# Patient Record
Sex: Female | Born: 1945
Health system: Southern US, Community
[De-identification: ages and names within clinical notes are randomized; demographics above are authoritative.]

## PROBLEM LIST (undated history)

## (undated) DIAGNOSIS — K219 Gastro-esophageal reflux disease without esophagitis: Secondary | ICD-10-CM

## (undated) DIAGNOSIS — G4730 Sleep apnea, unspecified: Secondary | ICD-10-CM

## (undated) DIAGNOSIS — I10 Essential (primary) hypertension: Secondary | ICD-10-CM

## (undated) DIAGNOSIS — E119 Type 2 diabetes mellitus without complications: Secondary | ICD-10-CM

## (undated) DIAGNOSIS — E785 Hyperlipidemia, unspecified: Secondary | ICD-10-CM

## (undated) HISTORY — PX: APPENDECTOMY: SHX54

## (undated) HISTORY — PX: TUBAL LIGATION: SHX77

## (undated) HISTORY — DX: Essential (primary) hypertension: I10

## (undated) HISTORY — PX: BACK SURGERY: SHX140

## (undated) HISTORY — DX: Hyperlipidemia, unspecified: E78.5

---

## 1999-11-04 ENCOUNTER — Encounter: Payer: Self-pay | Admitting: Family Medicine

## 1999-11-04 ENCOUNTER — Ambulatory Visit (HOSPITAL_COMMUNITY): Admission: RE | Admit: 1999-11-04 | Discharge: 1999-11-04 | Payer: Self-pay | Admitting: Family Medicine

## 1999-11-15 ENCOUNTER — Ambulatory Visit (HOSPITAL_COMMUNITY): Admission: RE | Admit: 1999-11-15 | Discharge: 1999-11-16 | Payer: Self-pay | Admitting: Neurosurgery

## 1999-11-15 ENCOUNTER — Encounter: Payer: Self-pay | Admitting: Neurosurgery

## 2000-10-09 ENCOUNTER — Ambulatory Visit (HOSPITAL_COMMUNITY): Admission: RE | Admit: 2000-10-09 | Discharge: 2000-10-09 | Payer: Self-pay | Admitting: Family Medicine

## 2000-10-09 ENCOUNTER — Encounter: Payer: Self-pay | Admitting: Family Medicine

## 2001-10-21 ENCOUNTER — Ambulatory Visit (HOSPITAL_COMMUNITY): Admission: RE | Admit: 2001-10-21 | Discharge: 2001-10-21 | Payer: Self-pay | Admitting: Family Medicine

## 2001-10-21 ENCOUNTER — Encounter: Payer: Self-pay | Admitting: Family Medicine

## 2002-10-13 ENCOUNTER — Ambulatory Visit (HOSPITAL_COMMUNITY): Admission: RE | Admit: 2002-10-13 | Discharge: 2002-10-13 | Payer: Self-pay | Admitting: Family Medicine

## 2002-10-13 ENCOUNTER — Encounter: Payer: Self-pay | Admitting: Family Medicine

## 2002-12-31 ENCOUNTER — Ambulatory Visit (HOSPITAL_COMMUNITY): Admission: RE | Admit: 2002-12-31 | Discharge: 2002-12-31 | Payer: Self-pay | Admitting: Family Medicine

## 2003-02-02 ENCOUNTER — Ambulatory Visit: Admission: RE | Admit: 2003-02-02 | Discharge: 2003-02-02 | Payer: Self-pay | Admitting: Family Medicine

## 2003-03-23 ENCOUNTER — Ambulatory Visit (HOSPITAL_COMMUNITY): Admission: RE | Admit: 2003-03-23 | Discharge: 2003-03-23 | Payer: Self-pay | Admitting: Internal Medicine

## 2006-05-14 ENCOUNTER — Ambulatory Visit (HOSPITAL_COMMUNITY): Admission: RE | Admit: 2006-05-14 | Discharge: 2006-05-14 | Payer: Self-pay | Admitting: Family Medicine

## 2006-06-11 ENCOUNTER — Ambulatory Visit (HOSPITAL_COMMUNITY): Admission: RE | Admit: 2006-06-11 | Discharge: 2006-06-11 | Payer: Self-pay | Admitting: Family Medicine

## 2006-07-16 ENCOUNTER — Ambulatory Visit (HOSPITAL_COMMUNITY): Admission: RE | Admit: 2006-07-16 | Discharge: 2006-07-16 | Payer: Self-pay | Admitting: Family Medicine

## 2007-07-08 ENCOUNTER — Ambulatory Visit (HOSPITAL_COMMUNITY): Admission: RE | Admit: 2007-07-08 | Discharge: 2007-07-08 | Payer: Self-pay | Admitting: Family Medicine

## 2009-06-30 ENCOUNTER — Ambulatory Visit (HOSPITAL_COMMUNITY): Admission: RE | Admit: 2009-06-30 | Discharge: 2009-06-30 | Payer: Self-pay | Admitting: Family Medicine

## 2010-07-21 ENCOUNTER — Other Ambulatory Visit (HOSPITAL_COMMUNITY): Payer: Self-pay | Admitting: Family Medicine

## 2010-07-21 DIAGNOSIS — Z139 Encounter for screening, unspecified: Secondary | ICD-10-CM

## 2010-07-22 NOTE — Op Note (Signed)
. [facility]  Patient:    Maria Morrison, Maria Morrison                     MRN: [id number] Proc. Date: 11/15/99 Adm. Date:  [id number] Attending:  Donn Pierini                           Operative Report  PREOPERATIVE DIAGNOSIS:  Right L2-3 extraforaminal herniated nucleus pulposus with radiculopathy.  POSTOPERATIVE DIAGNOSIS:  Right L2-3 extraforaminal herniated nucleus pulposus with radiculopathy.  OPERATION PERFORMED:  Right L2-3 extraforaminal microdiskectomy.  SURGEON:  Julio Sicks, M.D.  ASSISTANT:  Reinaldo Meeker, M.D.  ANESTHESIA:  General endotracheal.  INDICATIONS FOR PROCEDURE:  Ms. Collison is a 65 year old female with history of severe back and left lower extremity pain consistent with an L2 radiculopathy.  This has failed conservative management.  MRI scan demonstrates a large left-sided L2-3 extraforaminal disk herniation with compression of the left-sided L2 nerve root.  The patient has been counseled as to her options.  She has decided to proceed with the left-sided L2-3 extraforaminal microdiskectomy for hopeful relief of her symptoms.  DESCRIPTION OF PROCEDURE:  The patient was taken to the operating room and placed on the table in supine position.  After adequate level of anesthesia was achieved, the patient was positioned prone onto a wilson frame and appropriately padded.  The patients lumbar region was shaved and prepped steriley.  A 10 blade was used to make a linear skin incision overlying the L2-3 level.  This was carried down sharply in the midline.  A subperiosteal dissection was performed exposing the transverse process and facet of L2 as well as the superior facet of L3.  A deep ____________ was placed.  X-rays taken.  The level was confirmed.  An extraforaminal approach was then performed by removing the anterior aspect of the transverse process and lateral aspect of the pars interarticularis using Kerrison rongeurs.   The ligament was then elevated and resected in piecemeal fashion.  The microscope was brought into the field and used for microdissection.  The exiting L2 nerve root was identified.  Paraneural venous plexus was coagulated and cut.  The nerve root was elevated gently superiorly.  A large amount of free disk herniation was then encountered and dissected free using blunt nerve hooks and Penfield instrument.  All free elements of disk herniation were completely removed.  The disk space was isolated and incised with a 15 blade in rectangular fashion.  A wide disk space cleanout was then achieved using pituitary rongeurs, upward angled pituitary rongeurs and Epstein curets.  All loose or obviously degenerated disk material was removed from the interspace. At this point there was no evidence of any further compression of the left-sided L2 nerve root.  The wound was then copiously irrigated with antibiotic solution.  Gelfoam was placed topically for hemostasis.  The wound was then closed in layers with Vicryl sutures.  Staples were applied to the surface.  There were no apparent complications.  The patient tolerated the procedure well and she returned to the recovery postoperatively. DD:  11/15/99 TD:  11/16/99 Job: [id number]

## 2010-07-22 NOTE — Op Note (Signed)
NAME:  Maria Morrison, Maria Morrison                        ACCOUNT NO.:  [id number]   MEDICAL RECORD NO.:  [id number]                   PATIENT TYPE:  AMB   LOCATION:  DAY                                  FACILITY:  [facility]   PHYSICIAN:  R. Roetta Sessions, M.D.              DATE OF BIRTH:  [birth year]   DATE OF PROCEDURE:  DATE OF DISCHARGE:                                 OPERATIVE REPORT   PROCEDURE:  Colonoscopy with biopsy.   ENDOSCOPIST:  Gerrit Friends. Rourk, M.D.   INDICATIONS FOR PROCEDURE:  The patient is a 65 year old lady sent over out  of the courtesy of Dr. Patrica Duel for colorectal cancer screening.  She  has never had a colonoscopy.  She is devoid of any lower GI tract symptoms.  There is no family history of colorectal neoplasia.  Colonoscopy is now  being done as a screening maneuver.  This approach has been discussed with  the patient at length.  The potential risks, benefits, and alternatives have  been reviewed.  Please see my handwritten H&P for more information.   PROCEDURE NOTE:  O2 saturation, blood pressure, pulse and respirations were  monitored throughout the entire procedure.  Conscious sedation: Versed 3 mg IV, Demerol 75 mg IV in divided doses.   INSTRUMENT:  Olympus 140 colonoscope.   FINDINGS:  Digital rectal exam revealed no abnormalities.   ENDOSCOPIC FINDINGS:  The prep was good.   RECTUM:  Examination of the rectal mucosa including the retroflex view of  the anal verge revealed only 2 anal papilla and internal hemorrhoids.  The  remainder of the rectal mucosa appeared normal.   COLON:  The colonic mucosa was surveyed from the rectosigmoid junction  through the left transverse and right colon to the area of the appendiceal  orifice, ileocecal valve, and cecum.  These structures were well seen and  photographed for the record.   From this level the scope was slowly withdrawn.  All previously mentioned  mucosal surfaces were again seen.  The only abnormality  was a 3-cm, sessile  polyp at 30 cm which was cold biopsied/removed.  The patient tolerated the  procedure well and was reacted in endoscopy.   IMPRESSION:  1. Anal papilla and internal hemorrhoids, otherwise normal rectum.  2. Diminutive polyp at 30 cm; cold biopsied/removed.  3. The remainder of the colonic mucosa appeared normal.   RECOMMENDATIONS:  1. Follow up on path.  2. Further recommendations to follow.      ___________________________________________                                            Jonathon Bellows, M.D.   RMR/MEDQ  D:  03/23/2003  T:  03/23/2003  Job:  [id number]   cc:   Patrica Duel, M.D.  [address]  [address]  [place]  [place] [address]  Fax: [phone]   R. Roetta Sessions, M.D.  [address]  [place]  [place] [address]  Fax: [phone]

## 2010-07-25 ENCOUNTER — Ambulatory Visit (HOSPITAL_COMMUNITY)
Admission: RE | Admit: 2010-07-25 | Discharge: 2010-07-25 | Disposition: A | Discharge status: Home / Self Care | Payer: Medicare Other | Source: Ambulatory Visit | Attending: Family Medicine | Admitting: Family Medicine

## 2010-07-25 DIAGNOSIS — Z139 Encounter for screening, unspecified: Secondary | ICD-10-CM

## 2010-07-25 DIAGNOSIS — Z1231 Encounter for screening mammogram for malignant neoplasm of breast: Secondary | ICD-10-CM | POA: Insufficient documentation

## 2011-03-14 ENCOUNTER — Other Ambulatory Visit (HOSPITAL_COMMUNITY): Payer: Self-pay | Admitting: Family Medicine

## 2011-03-14 DIAGNOSIS — I10 Essential (primary) hypertension: Secondary | ICD-10-CM | POA: Diagnosis not present

## 2011-03-14 DIAGNOSIS — Z139 Encounter for screening, unspecified: Secondary | ICD-10-CM

## 2011-03-14 DIAGNOSIS — E119 Type 2 diabetes mellitus without complications: Secondary | ICD-10-CM | POA: Diagnosis not present

## 2011-03-14 DIAGNOSIS — Z6832 Body mass index (BMI) 32.0-32.9, adult: Secondary | ICD-10-CM | POA: Diagnosis not present

## 2011-03-21 ENCOUNTER — Other Ambulatory Visit (HOSPITAL_COMMUNITY): Payer: Medicare Other

## 2011-03-27 ENCOUNTER — Ambulatory Visit (HOSPITAL_COMMUNITY)
Admission: RE | Admit: 2011-03-27 | Discharge: 2011-03-27 | Disposition: A | Discharge status: Home / Self Care | Payer: Medicare Other | Source: Ambulatory Visit | Attending: Family Medicine | Admitting: Family Medicine

## 2011-03-27 DIAGNOSIS — Z13820 Encounter for screening for osteoporosis: Secondary | ICD-10-CM | POA: Insufficient documentation

## 2011-03-27 DIAGNOSIS — Z139 Encounter for screening, unspecified: Secondary | ICD-10-CM

## 2011-03-27 DIAGNOSIS — M949 Disorder of cartilage, unspecified: Secondary | ICD-10-CM | POA: Diagnosis not present

## 2011-03-27 DIAGNOSIS — M899 Disorder of bone, unspecified: Secondary | ICD-10-CM | POA: Insufficient documentation

## 2011-03-27 DIAGNOSIS — Z780 Asymptomatic menopausal state: Secondary | ICD-10-CM | POA: Insufficient documentation

## 2011-07-07 DIAGNOSIS — E119 Type 2 diabetes mellitus without complications: Secondary | ICD-10-CM | POA: Diagnosis not present

## 2011-07-07 DIAGNOSIS — H5200 Hypermetropia, unspecified eye: Secondary | ICD-10-CM | POA: Diagnosis not present

## 2011-07-07 DIAGNOSIS — H52229 Regular astigmatism, unspecified eye: Secondary | ICD-10-CM | POA: Diagnosis not present

## 2011-07-07 DIAGNOSIS — H04129 Dry eye syndrome of unspecified lacrimal gland: Secondary | ICD-10-CM | POA: Diagnosis not present

## 2011-10-24 DIAGNOSIS — M779 Enthesopathy, unspecified: Secondary | ICD-10-CM | POA: Diagnosis not present

## 2011-10-24 DIAGNOSIS — Z6832 Body mass index (BMI) 32.0-32.9, adult: Secondary | ICD-10-CM | POA: Diagnosis not present

## 2011-10-24 DIAGNOSIS — I10 Essential (primary) hypertension: Secondary | ICD-10-CM | POA: Diagnosis not present

## 2011-10-24 DIAGNOSIS — E119 Type 2 diabetes mellitus without complications: Secondary | ICD-10-CM | POA: Diagnosis not present

## 2011-10-24 DIAGNOSIS — Z7182 Exercise counseling: Secondary | ICD-10-CM | POA: Diagnosis not present

## 2011-10-30 DIAGNOSIS — M779 Enthesopathy, unspecified: Secondary | ICD-10-CM | POA: Diagnosis not present

## 2011-10-30 DIAGNOSIS — E119 Type 2 diabetes mellitus without complications: Secondary | ICD-10-CM | POA: Diagnosis not present

## 2012-06-11 ENCOUNTER — Other Ambulatory Visit (HOSPITAL_COMMUNITY): Payer: Self-pay | Admitting: Family Medicine

## 2012-06-11 DIAGNOSIS — E785 Hyperlipidemia, unspecified: Secondary | ICD-10-CM | POA: Diagnosis not present

## 2012-06-11 DIAGNOSIS — Z7182 Exercise counseling: Secondary | ICD-10-CM | POA: Diagnosis not present

## 2012-06-11 DIAGNOSIS — N182 Chronic kidney disease, stage 2 (mild): Secondary | ICD-10-CM | POA: Diagnosis not present

## 2012-06-11 DIAGNOSIS — Z139 Encounter for screening, unspecified: Secondary | ICD-10-CM

## 2012-06-11 DIAGNOSIS — I10 Essential (primary) hypertension: Secondary | ICD-10-CM | POA: Diagnosis not present

## 2012-06-11 DIAGNOSIS — E1129 Type 2 diabetes mellitus with other diabetic kidney complication: Secondary | ICD-10-CM | POA: Diagnosis not present

## 2012-06-11 DIAGNOSIS — Z713 Dietary counseling and surveillance: Secondary | ICD-10-CM | POA: Diagnosis not present

## 2012-06-11 DIAGNOSIS — R809 Proteinuria, unspecified: Secondary | ICD-10-CM | POA: Diagnosis not present

## 2012-06-17 ENCOUNTER — Ambulatory Visit (HOSPITAL_COMMUNITY)
Admission: RE | Admit: 2012-06-17 | Discharge: 2012-06-17 | Disposition: A | Discharge status: Home / Self Care | Payer: Medicare Other | Source: Ambulatory Visit | Attending: Family Medicine | Admitting: Family Medicine

## 2012-06-17 DIAGNOSIS — Z1231 Encounter for screening mammogram for malignant neoplasm of breast: Secondary | ICD-10-CM | POA: Insufficient documentation

## 2012-06-17 DIAGNOSIS — Z139 Encounter for screening, unspecified: Secondary | ICD-10-CM

## 2012-06-18 ENCOUNTER — Other Ambulatory Visit: Payer: Self-pay | Admitting: Family Medicine

## 2012-06-18 DIAGNOSIS — R928 Other abnormal and inconclusive findings on diagnostic imaging of breast: Secondary | ICD-10-CM

## 2012-07-03 ENCOUNTER — Ambulatory Visit (HOSPITAL_COMMUNITY)
Admission: RE | Admit: 2012-07-03 | Discharge: 2012-07-03 | Disposition: A | Discharge status: Home / Self Care | Payer: Medicare Other | Source: Ambulatory Visit | Attending: Family Medicine | Admitting: Family Medicine

## 2012-07-03 DIAGNOSIS — R928 Other abnormal and inconclusive findings on diagnostic imaging of breast: Secondary | ICD-10-CM | POA: Insufficient documentation

## 2012-07-10 ENCOUNTER — Other Ambulatory Visit (HOSPITAL_COMMUNITY): Payer: Self-pay | Admitting: Physician Assistant

## 2012-07-10 ENCOUNTER — Ambulatory Visit (HOSPITAL_COMMUNITY)
Admission: RE | Admit: 2012-07-10 | Discharge: 2012-07-10 | Disposition: A | Discharge status: Home / Self Care | Payer: Medicare Other | Source: Ambulatory Visit | Attending: Physician Assistant | Admitting: Physician Assistant

## 2012-07-10 DIAGNOSIS — M25569 Pain in unspecified knee: Secondary | ICD-10-CM | POA: Insufficient documentation

## 2012-07-10 DIAGNOSIS — M25562 Pain in left knee: Secondary | ICD-10-CM

## 2012-07-10 DIAGNOSIS — Z6831 Body mass index (BMI) 31.0-31.9, adult: Secondary | ICD-10-CM | POA: Diagnosis not present

## 2012-07-16 DIAGNOSIS — H5200 Hypermetropia, unspecified eye: Secondary | ICD-10-CM | POA: Diagnosis not present

## 2012-07-16 DIAGNOSIS — E119 Type 2 diabetes mellitus without complications: Secondary | ICD-10-CM | POA: Diagnosis not present

## 2012-07-16 DIAGNOSIS — H269 Unspecified cataract: Secondary | ICD-10-CM | POA: Diagnosis not present

## 2012-07-16 DIAGNOSIS — H04129 Dry eye syndrome of unspecified lacrimal gland: Secondary | ICD-10-CM | POA: Diagnosis not present

## 2012-07-23 DIAGNOSIS — Z6831 Body mass index (BMI) 31.0-31.9, adult: Secondary | ICD-10-CM | POA: Diagnosis not present

## 2012-07-23 DIAGNOSIS — I10 Essential (primary) hypertension: Secondary | ICD-10-CM | POA: Diagnosis not present

## 2012-07-23 DIAGNOSIS — E1129 Type 2 diabetes mellitus with other diabetic kidney complication: Secondary | ICD-10-CM | POA: Diagnosis not present

## 2012-07-23 DIAGNOSIS — M779 Enthesopathy, unspecified: Secondary | ICD-10-CM | POA: Diagnosis not present

## 2012-07-30 DIAGNOSIS — S8990XA Unspecified injury of unspecified lower leg, initial encounter: Secondary | ICD-10-CM | POA: Diagnosis not present

## 2012-07-30 DIAGNOSIS — S99919A Unspecified injury of unspecified ankle, initial encounter: Secondary | ICD-10-CM | POA: Diagnosis not present

## 2012-08-03 DIAGNOSIS — M25569 Pain in unspecified knee: Secondary | ICD-10-CM | POA: Diagnosis not present

## 2012-12-11 DIAGNOSIS — Z23 Encounter for immunization: Secondary | ICD-10-CM | POA: Diagnosis not present

## 2013-06-13 DIAGNOSIS — Z6832 Body mass index (BMI) 32.0-32.9, adult: Secondary | ICD-10-CM | POA: Diagnosis not present

## 2013-06-13 DIAGNOSIS — I10 Essential (primary) hypertension: Secondary | ICD-10-CM | POA: Diagnosis not present

## 2013-06-13 DIAGNOSIS — E785 Hyperlipidemia, unspecified: Secondary | ICD-10-CM | POA: Diagnosis not present

## 2013-06-13 DIAGNOSIS — Z23 Encounter for immunization: Secondary | ICD-10-CM | POA: Diagnosis not present

## 2013-06-13 DIAGNOSIS — E1129 Type 2 diabetes mellitus with other diabetic kidney complication: Secondary | ICD-10-CM | POA: Diagnosis not present

## 2013-08-13 ENCOUNTER — Other Ambulatory Visit (HOSPITAL_COMMUNITY): Payer: Self-pay | Admitting: Family Medicine

## 2013-08-13 DIAGNOSIS — Z1231 Encounter for screening mammogram for malignant neoplasm of breast: Secondary | ICD-10-CM

## 2013-08-18 DIAGNOSIS — L570 Actinic keratosis: Secondary | ICD-10-CM | POA: Diagnosis not present

## 2013-08-18 DIAGNOSIS — C44320 Squamous cell carcinoma of skin of unspecified parts of face: Secondary | ICD-10-CM | POA: Diagnosis not present

## 2013-08-18 DIAGNOSIS — D235 Other benign neoplasm of skin of trunk: Secondary | ICD-10-CM | POA: Diagnosis not present

## 2013-08-18 DIAGNOSIS — I781 Nevus, non-neoplastic: Secondary | ICD-10-CM | POA: Diagnosis not present

## 2013-08-19 ENCOUNTER — Ambulatory Visit (HOSPITAL_COMMUNITY)
Admission: RE | Admit: 2013-08-19 | Discharge: 2013-08-19 | Disposition: A | Discharge status: Home / Self Care | Payer: Medicare Other | Source: Ambulatory Visit | Attending: Family Medicine | Admitting: Family Medicine

## 2013-08-19 DIAGNOSIS — Z1231 Encounter for screening mammogram for malignant neoplasm of breast: Secondary | ICD-10-CM

## 2013-09-17 DIAGNOSIS — Z85828 Personal history of other malignant neoplasm of skin: Secondary | ICD-10-CM | POA: Diagnosis not present

## 2013-09-17 DIAGNOSIS — L570 Actinic keratosis: Secondary | ICD-10-CM | POA: Diagnosis not present

## 2013-10-14 DIAGNOSIS — K649 Unspecified hemorrhoids: Secondary | ICD-10-CM | POA: Diagnosis not present

## 2013-10-14 DIAGNOSIS — Z6832 Body mass index (BMI) 32.0-32.9, adult: Secondary | ICD-10-CM | POA: Diagnosis not present

## 2013-10-14 DIAGNOSIS — N390 Urinary tract infection, site not specified: Secondary | ICD-10-CM | POA: Diagnosis not present

## 2013-11-22 DIAGNOSIS — Z23 Encounter for immunization: Secondary | ICD-10-CM | POA: Diagnosis not present

## 2013-12-10 DIAGNOSIS — E6609 Other obesity due to excess calories: Secondary | ICD-10-CM | POA: Diagnosis not present

## 2013-12-10 DIAGNOSIS — Z6832 Body mass index (BMI) 32.0-32.9, adult: Secondary | ICD-10-CM | POA: Diagnosis not present

## 2013-12-10 DIAGNOSIS — E119 Type 2 diabetes mellitus without complications: Secondary | ICD-10-CM | POA: Diagnosis not present

## 2013-12-10 DIAGNOSIS — E1129 Type 2 diabetes mellitus with other diabetic kidney complication: Secondary | ICD-10-CM | POA: Diagnosis not present

## 2013-12-10 DIAGNOSIS — E669 Obesity, unspecified: Secondary | ICD-10-CM | POA: Diagnosis not present

## 2013-12-10 DIAGNOSIS — N182 Chronic kidney disease, stage 2 (mild): Secondary | ICD-10-CM | POA: Diagnosis not present

## 2013-12-10 DIAGNOSIS — I10 Essential (primary) hypertension: Secondary | ICD-10-CM | POA: Diagnosis not present

## 2013-12-12 DIAGNOSIS — H52223 Regular astigmatism, bilateral: Secondary | ICD-10-CM | POA: Diagnosis not present

## 2013-12-12 DIAGNOSIS — H5203 Hypermetropia, bilateral: Secondary | ICD-10-CM | POA: Diagnosis not present

## 2013-12-12 DIAGNOSIS — E119 Type 2 diabetes mellitus without complications: Secondary | ICD-10-CM | POA: Diagnosis not present

## 2013-12-12 DIAGNOSIS — H2510 Age-related nuclear cataract, unspecified eye: Secondary | ICD-10-CM | POA: Diagnosis not present

## 2014-01-21 DIAGNOSIS — I10 Essential (primary) hypertension: Secondary | ICD-10-CM | POA: Diagnosis not present

## 2014-01-21 DIAGNOSIS — E6609 Other obesity due to excess calories: Secondary | ICD-10-CM | POA: Diagnosis not present

## 2014-01-21 DIAGNOSIS — Z6833 Body mass index (BMI) 33.0-33.9, adult: Secondary | ICD-10-CM | POA: Diagnosis not present

## 2014-02-03 ENCOUNTER — Telehealth: Payer: Self-pay

## 2014-02-03 ENCOUNTER — Other Ambulatory Visit: Payer: Self-pay

## 2014-02-03 DIAGNOSIS — Z1211 Encounter for screening for malignant neoplasm of colon: Secondary | ICD-10-CM

## 2014-02-03 NOTE — Telephone Encounter (Signed)
Patient received letter from DS to set up tcs. Patient said her insurance will be changing on 02/17/14 and had questions about coverage. Please call [phone]

## 2014-02-03 NOTE — Telephone Encounter (Signed)
Gastroenterology Pre-Procedure Review  Request Date: 02/03/2014 Requesting Physician: Delman Cheadle, NP  PATIENT REVIEW QUESTIONS: The patient responded to the following health history questions as indicated:    1. Diabetes Melitis: YES 2. Joint replacements in the past 12 months: no 3. Major health problems in the past 3 months: no 4. Has an artificial valve or MVP: no 5. Has a defibrillator: no 6. Has been advised in past to take antibiotics in advance of a procedure like teeth cleaning: no    MEDICATIONS & ALLERGIES:    Patient reports the following regarding taking any blood thinners:   Plavix? no Aspirin? no Coumadin? no  Patient confirms/reports the following medications:  Current Outpatient Prescriptions  Medication Sig Dispense Refill  . atorvastatin (LIPITOR) 10 MG tablet Take 10 mg by mouth daily.    Marland Kitchen lisinopril (PRINIVIL,ZESTRIL) 10 MG tablet Take 10 mg by mouth daily.    . Melatonin 3 MG CAPS Take 3 mg by mouth at bedtime.    . metFORMIN (GLUCOPHAGE) 500 MG tablet Take 500 mg by mouth daily with breakfast.    . polyethylene glycol (MIRALAX / GLYCOLAX) packet Take 17 g by mouth every other day.     No current facility-administered medications for this visit.    Patient confirms/reports the following allergies:  No Known Allergies  No orders of the defined types were placed in this encounter.    AUTHORIZATION INFORMATION Primary Insurance:   ID #:   Group #:  Pre-Cert / Auth required: Pre-Cert / Auth #:   Secondary Insurance:   ID #: Group #:  Pre-Cert / Auth required:  Pre-Cert / Auth #:   SCHEDULE INFORMATION: Procedure has been scheduled as follows:  Date: 02/25/2014                  Time: 9:30 AM  Location: [facility] Short Stay  This Gastroenterology Pre-Precedure Review Form is being routed to the following provider(s): R. Garfield Cornea, MD

## 2014-02-04 NOTE — Telephone Encounter (Signed)
Appropriate.  Take 1/2 dose of metformin the evening prior and none the day of.

## 2014-02-10 MED ORDER — PEG-KCL-NACL-NASULF-NA ASC-C 100 G PO SOLR: 1.0000 | ORAL | Status: DC

## 2014-02-10 NOTE — Telephone Encounter (Signed)
Rx sent to the pharmacy and instructions mailed to pt.  

## 2014-02-10 NOTE — Addendum Note (Signed)
Addended by: Everardo All on: 02/10/2014 10:32 AM   Modules accepted: Orders

## 2014-02-25 ENCOUNTER — Encounter (HOSPITAL_COMMUNITY)
Admission: RE | Disposition: A | Discharge status: Home / Self Care | Payer: Self-pay | Source: Ambulatory Visit | Attending: Internal Medicine

## 2014-02-25 ENCOUNTER — Ambulatory Visit (HOSPITAL_COMMUNITY)
Admission: RE | Admit: 2014-02-25 | Discharge: 2014-02-25 | Disposition: A | Discharge status: Home / Self Care | Payer: Medicare Other | Source: Ambulatory Visit | Attending: Internal Medicine | Admitting: Internal Medicine

## 2014-02-25 ENCOUNTER — Encounter (HOSPITAL_COMMUNITY): Payer: Self-pay

## 2014-02-25 DIAGNOSIS — K219 Gastro-esophageal reflux disease without esophagitis: Secondary | ICD-10-CM | POA: Diagnosis not present

## 2014-02-25 DIAGNOSIS — E119 Type 2 diabetes mellitus without complications: Secondary | ICD-10-CM | POA: Insufficient documentation

## 2014-02-25 DIAGNOSIS — K648 Other hemorrhoids: Secondary | ICD-10-CM | POA: Diagnosis not present

## 2014-02-25 DIAGNOSIS — D123 Benign neoplasm of transverse colon: Secondary | ICD-10-CM | POA: Insufficient documentation

## 2014-02-25 DIAGNOSIS — Z1211 Encounter for screening for malignant neoplasm of colon: Secondary | ICD-10-CM | POA: Diagnosis not present

## 2014-02-25 DIAGNOSIS — Z86010 Personal history of colonic polyps: Secondary | ICD-10-CM | POA: Insufficient documentation

## 2014-02-25 HISTORY — DX: Type 2 diabetes mellitus without complications: E11.9

## 2014-02-25 HISTORY — PX: COLONOSCOPY: SHX5424

## 2014-02-25 HISTORY — DX: Gastro-esophageal reflux disease without esophagitis: K21.9

## 2014-02-25 LAB — GLUCOSE, CAPILLARY: GLUCOSE-CAPILLARY: 113 mg/dL — AB (ref 70–99)

## 2014-02-25 SURGERY — COLONOSCOPY
Anesthesia: Moderate Sedation

## 2014-02-25 MED ORDER — STERILE WATER FOR IRRIGATION IR SOLN
Status: DC | PRN
  Administered 2014-02-25: 10:00:00

## 2014-02-25 MED ORDER — SODIUM CHLORIDE 0.9 % IV SOLN
INTRAVENOUS | Status: DC
  Administered 2014-02-25: 09:00:00 via INTRAVENOUS

## 2014-02-25 MED ORDER — ONDANSETRON HCL 4 MG/2ML IJ SOLN
INTRAMUSCULAR | Status: DC | PRN
  Administered 2014-02-25: 4 mg via INTRAVENOUS

## 2014-02-25 MED ORDER — ONDANSETRON HCL 4 MG/2ML IJ SOLN
INTRAMUSCULAR | Status: AC
  Filled 2014-02-25: qty 2

## 2014-02-25 MED ORDER — MEPERIDINE HCL 100 MG/ML IJ SOLN
INTRAMUSCULAR | Status: AC
  Filled 2014-02-25: qty 2

## 2014-02-25 MED ORDER — MIDAZOLAM HCL 5 MG/5ML IJ SOLN
INTRAMUSCULAR | Status: DC | PRN
  Administered 2014-02-25: 2 mg via INTRAVENOUS
  Administered 2014-02-25 (×3): 1 mg via INTRAVENOUS

## 2014-02-25 MED ORDER — MIDAZOLAM HCL 5 MG/5ML IJ SOLN
INTRAMUSCULAR | Status: AC
  Filled 2014-02-25: qty 10

## 2014-02-25 MED ORDER — MEPERIDINE HCL 100 MG/ML IJ SOLN
INTRAMUSCULAR | Status: DC | PRN
  Administered 2014-02-25 (×3): 25 mg via INTRAVENOUS

## 2014-02-25 NOTE — H&P (Signed)
'@LOGO' @   Primary Care Physician:  Leonides Grills, MD Primary Gastroenterologist:  Dr. Gala Romney  Pre-Procedure History & Physical: HPI:  Maria Morrison is a 68 y.o. female is here for a screening colonoscopy. Negative colonoscopy reportedly 10 years ago-records unavailable. No bowel symptoms. No family history of colon cancer.  Past Medical History  Diagnosis Date  . GERD (gastroesophageal reflux disease)   . Diabetes mellitus without complication     Past Surgical History  Procedure Laterality Date  . Appendectomy    . Tubal ligation    . Back surgery      Prior to Admission medications   Medication Sig Start Date End Date Taking? Authorizing Provider  acetaminophen (TYLENOL) 500 MG tablet Take 1,000 mg by mouth every 6 (six) hours as needed for moderate pain (for back and knee pain).   Yes Historical Provider, MD  atorvastatin (LIPITOR) 10 MG tablet Take 10 mg by mouth daily.   Yes Historical Provider, MD  lisinopril (PRINIVIL,ZESTRIL) 10 MG tablet Take 10 mg by mouth daily.   Yes Historical Provider, MD  Melatonin 3 MG CAPS Take 3 mg by mouth at bedtime.   Yes Historical Provider, MD  metFORMIN (GLUCOPHAGE) 500 MG tablet Take 500 mg by mouth daily with breakfast.   Yes Historical Provider, MD  peg 3350 powder (MOVIPREP) 100 G SOLR Take 1 kit (200 g total) by mouth as directed. 02/10/14  Yes Daneil Dolin, MD  polyethylene glycol [facility] / Floria Raveling) packet Take 17 g by mouth every other day.   Yes Historical Provider, MD    Allergies as of 02/03/2014  . (No Known Allergies)    History reviewed. No pertinent family history.  History   Social History  . Marital Status: Married    Spouse Name: N/A    Number of Children: N/A  . Years of Education: N/A   Occupational History  . Not on file.   Social History Main Topics  . Smoking status: Never Smoker   . Smokeless tobacco: Not on file  . Alcohol Use: No  . Drug Use: No  . Sexual Activity: Not on file   Other  Topics Concern  . Not on file   Social History Narrative  . No narrative on file    Review of Systems: See HPI, otherwise negative ROS  Physical Exam: BP 136/59 mmHg  Pulse 60  Temp(Src) 97.8 F (36.6 C) (Oral)  Resp 15  Ht '5\' 3"'  (1.6 m)  Wt 180 lb (81.647 kg)  BMI 31.89 kg/m2  SpO2 97% General:   Alert,  Well-developed, well-nourished, pleasant and cooperative in NAD Head:  Normocephalic and atraumatic. Eyes:  Sclera clear, no icterus.   Conjunctiva pink. Ears:  Normal auditory acuity. Nose:  No deformity, discharge,  or lesions. Mouth:  No deformity or lesions, dentition normal. Neck:  Supple; no masses or thyromegaly. Lungs:  Clear throughout to auscultation.   No wheezes, crackles, or rhonchi. No acute distress. Heart:  Regular rate and rhythm; no murmurs, clicks, rubs,  or gallops. Abdomen:  Soft, nontender and nondistended. No masses, hepatosplenomegaly or hernias noted. Normal bowel sounds, without guarding, and without rebound.   Msk:  Symmetrical without gross deformities. Normal posture. Pulses:  Normal pulses noted. Extremities:  Without clubbing or edema. Neurologic:  Alert and  oriented x4;  grossly normal neurologically. Skin:  Intact without significant lesions or rashes. Cervical Nodes:  No significant cervical adenopathy. Psych:  Alert and cooperative. Normal mood and affect.  Impression/Plan: Maria Go  B Morrison is now here to undergo a screening colonoscopy.  Average risk screening examination. Risks, benefits, limitations, imponderables and alternatives regarding colonoscopy have been reviewed with the patient. Questions have been answered. All parties agreeable.     Notice:  This dictation was prepared with Dragon dictation along with smaller phrase technology. Any transcriptional errors that result from this process are unintentional and may not be corrected upon review.

## 2014-02-25 NOTE — Discharge Instructions (Signed)
°Colonoscopy °Discharge Instructions ° °Read the instructions outlined below and refer to this sheet in the next few weeks. These discharge instructions provide you with general information on caring for yourself after you leave the hospital. Your doctor may also give you specific instructions. While your treatment has been planned according to the most current medical practices available, unavoidable complications occasionally occur. If you have any problems or questions after discharge, call Dr. Rourk at [phone]. °ACTIVITY °· You may resume your regular activity, but move at a slower pace for the next 24 hours.  °· Take frequent rest periods for the next 24 hours.  °· Walking will help get rid of the air and reduce the bloated feeling in your belly (abdomen).  °· No driving for 24 hours (because of the medicine (anesthesia) used during the test).   °· Do not sign any important legal documents or operate any machinery for 24 hours (because of the anesthesia used during the test).  °NUTRITION °· Drink plenty of fluids.  °· You may resume your normal diet as instructed by your doctor.  °· Begin with a light meal and progress to your normal diet. Heavy or fried foods are harder to digest and may make you feel sick to your stomach (nauseated).  °· Avoid alcoholic beverages for 24 hours or as instructed.  °MEDICATIONS °· You may resume your normal medications unless your doctor tells you otherwise.  °WHAT YOU CAN EXPECT TODAY °· Some feelings of bloating in the abdomen.  °· Passage of more gas than usual.  °· Spotting of blood in your stool or on the toilet paper.  °IF YOU HAD POLYPS REMOVED DURING THE COLONOSCOPY: °· No aspirin products for 7 days or as instructed.  °· No alcohol for 7 days or as instructed.  °· Eat a soft diet for the next 24 hours.  °FINDING OUT THE RESULTS OF YOUR TEST °Not all test results are available during your visit. If your test results are not back during the visit, make an appointment  with your caregiver to find out the results. Do not assume everything is normal if you have not heard from your caregiver or the medical facility. It is important for you to follow up on all of your test results.  °SEEK IMMEDIATE MEDICAL ATTENTION IF: °· You have more than a spotting of blood in your stool.  °· Your belly is swollen (abdominal distention).  °· You are nauseated or vomiting.  °· You have a temperature over 101.  °· You have abdominal pain or discomfort that is severe or gets worse throughout the day.  ° ° °Polyp information provided ° °Further recommendations to follow pending review of pathology report ° °Colon Polyps °Polyps are lumps of extra tissue growing inside the body. Polyps can grow in the large intestine (colon). Most colon polyps are noncancerous (benign). However, some colon polyps can become cancerous over time. Polyps that are larger than a pea may be harmful. To be safe, caregivers remove and test all polyps. °CAUSES  °Polyps form when mutations in the genes cause your cells to grow and divide even though no more tissue is needed. °RISK FACTORS °There are a number of risk factors that can increase your chances of getting colon polyps. They include: °· Being older than 50 years. °· Family history of colon polyps or colon cancer. °· Long-term colon diseases, such as colitis or Crohn disease. °· Being overweight. °· Smoking. °· Being inactive. °· Drinking too much alcohol. °SYMPTOMS  °  Most small polyps do not cause symptoms. If symptoms are present, they may include: °· Blood in the stool. The stool may look dark red or black. °· Constipation or diarrhea that lasts longer than 1 week. °DIAGNOSIS °People often do not know they have polyps until their caregiver finds them during a regular checkup. Your caregiver can use 4 tests to check for polyps: °· Digital rectal exam. The caregiver wears gloves and feels inside the rectum. This test would find polyps only in the rectum. °· Barium  enema. The caregiver puts a liquid called barium into your rectum before taking X-rays of your colon. Barium makes your colon look white. Polyps are dark, so they are easy to see in the X-ray pictures. °· Sigmoidoscopy. A thin, flexible tube (sigmoidoscope) is placed into your rectum. The sigmoidoscope has a light and tiny camera in it. The caregiver uses the sigmoidoscope to look at the last third of your colon. °· Colonoscopy. This test is like sigmoidoscopy, but the caregiver looks at the entire colon. This is the most common method for finding and removing polyps. °TREATMENT  °Any polyps will be removed during a sigmoidoscopy or colonoscopy. The polyps are then tested for cancer. °PREVENTION  °To help lower your risk of getting more colon polyps: °· Eat plenty of fruits and vegetables. Avoid eating fatty foods. °· Do not smoke. °· Avoid drinking alcohol. °· Exercise every day. °· Lose weight if recommended by your caregiver. °· Eat plenty of calcium and folate. Foods that are rich in calcium include milk, cheese, and broccoli. Foods that are rich in folate include chickpeas, kidney beans, and spinach. °HOME CARE INSTRUCTIONS °Keep all follow-up appointments as directed by your caregiver. You may need periodic exams to check for polyps. °SEEK MEDICAL CARE IF: °You notice bleeding during a bowel movement. °Document Released: 11/17/2003 Document Revised: 05/15/2011 Document Reviewed: 05/02/2011 °[employer]® Patient Information ©2015 [employer]. This information is not intended to replace advice given to you by your health care provider. Make sure you discuss any questions you have with your health care provider. ° °

## 2014-02-25 NOTE — Op Note (Signed)
[facility] [address] [place], [address]   COLONOSCOPY PROCEDURE REPORT  PATIENT: Maria Morrison, Maria Morrison  MR#: [id number] BIRTHDATE: 12-20-1945 , 20  yrs. old GENDER: female ENDOSCOPIST: R.  Garfield Cornea, MD FACP [facility] REFERRED YI:FOYDXAJ Orson Ape, M.D. PROCEDURE DATE:  03-07-14 PROCEDURE:   Colonoscopy with snare polypectomyg INDICATIONS:Average risk colorectal cancer screening examination. MEDICATIONS: Versed 5 mg IV and Demerol 75 mg IV in divided doses. Zofran 4 mg IV. ASA CLASS:       Class II  CONSENT: The risks, benefits, alternatives and imponderables including but not limited to bleeding, perforation as well as the possibility of a missed lesion have been reviewed.  The potential for biopsy, lesion removal, etc. have also been discussed. Questions have been answered.  All parties agreeable.  Please see the history and physical in the medical record for more information.  DESCRIPTION OF PROCEDURE:   After the risks benefits and alternatives of the procedure were thoroughly explained, informed consent was obtained.  The digital rectal exam revealed no abnormalities of the rectum.   The EC-3890Li ([id number])  endoscope was introduced through the anus and advanced to the cecum, which was identified by both the appendix and ileocecal valve. No adverse events experienced.   The quality of the prep was adequate.  The instrument was then slowly withdrawn as the colon was fully examined.      COLON FINDINGS: Normal rectum aside from a couple of anal papilla and internal hemorrhoids.  (2) 5 mm polyps at the splenic flexure; otherwise, the remainder of the colonic mucosa appeared normal. Retroflexion was performed. .  Withdrawal time=8 minutes 0 seconds.  The scope was withdrawn and the procedure completed. COMPLICATIONS: There were no immediate complications.  ENDOSCOPIC IMPRESSION: Anal papilla and internal hemorrhoids. Colonic polyps?"removed  as described above.  RECOMMENDATIONS: Follow up on pathology.  eSigned:  R. Garfield Cornea, MD Rosalita Chessman [facility] 03/07/14 10:09 AM   cc:  CPT CODES: ICD CODES:  The ICD and CPT codes recommended by this software are interpretations from the data that the clinical staff has captured with the software.  The verification of the translation of this report to the ICD and CPT codes and modifiers is the sole responsibility of the health care institution and practicing physician where this report was generated.  [employer] will not be held responsible for the validity of the ICD and CPT codes included on this report.  AMA assumes no liability for data contained or not contained herein. [employer] is a Designer, television/film set of the [employer].  PATIENT NAME:  Maria Morrison, Maria Morrison MR#: [id number]

## 2014-02-26 ENCOUNTER — Encounter: Payer: Self-pay | Admitting: Internal Medicine

## 2014-03-02 ENCOUNTER — Encounter (HOSPITAL_COMMUNITY): Payer: Self-pay | Admitting: Internal Medicine

## 2014-03-24 DIAGNOSIS — E1129 Type 2 diabetes mellitus with other diabetic kidney complication: Secondary | ICD-10-CM | POA: Diagnosis not present

## 2014-04-08 ENCOUNTER — Other Ambulatory Visit (HOSPITAL_COMMUNITY): Payer: Self-pay | Admitting: Family Medicine

## 2014-04-08 DIAGNOSIS — Z0000 Encounter for general adult medical examination without abnormal findings: Secondary | ICD-10-CM | POA: Diagnosis not present

## 2014-04-08 DIAGNOSIS — E782 Mixed hyperlipidemia: Secondary | ICD-10-CM | POA: Diagnosis not present

## 2014-04-08 DIAGNOSIS — R0683 Snoring: Secondary | ICD-10-CM | POA: Diagnosis not present

## 2014-04-08 DIAGNOSIS — I10 Essential (primary) hypertension: Secondary | ICD-10-CM | POA: Diagnosis not present

## 2014-04-13 IMAGING — MG MM DIGITAL SCREENING
4 series · 4 of 4 positions shown · non-contrast
Comparison: Previous exams.

CLINICAL DATA: Screening.

DIGITAL BILATERAL SCREENING MAMMOGRAM WITH CAD

[L CC]
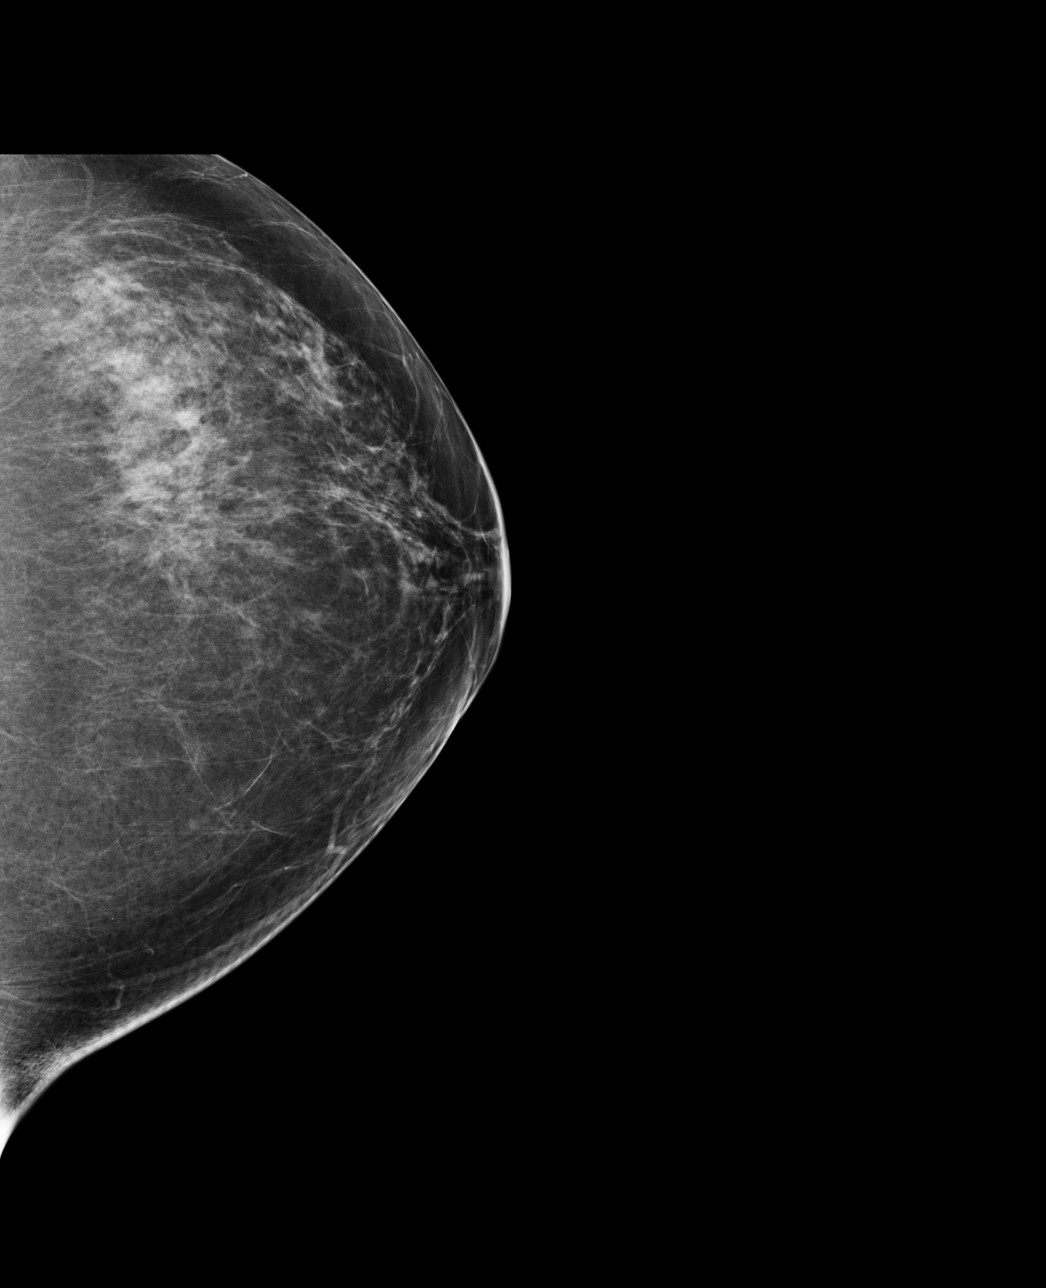

[L MLO]
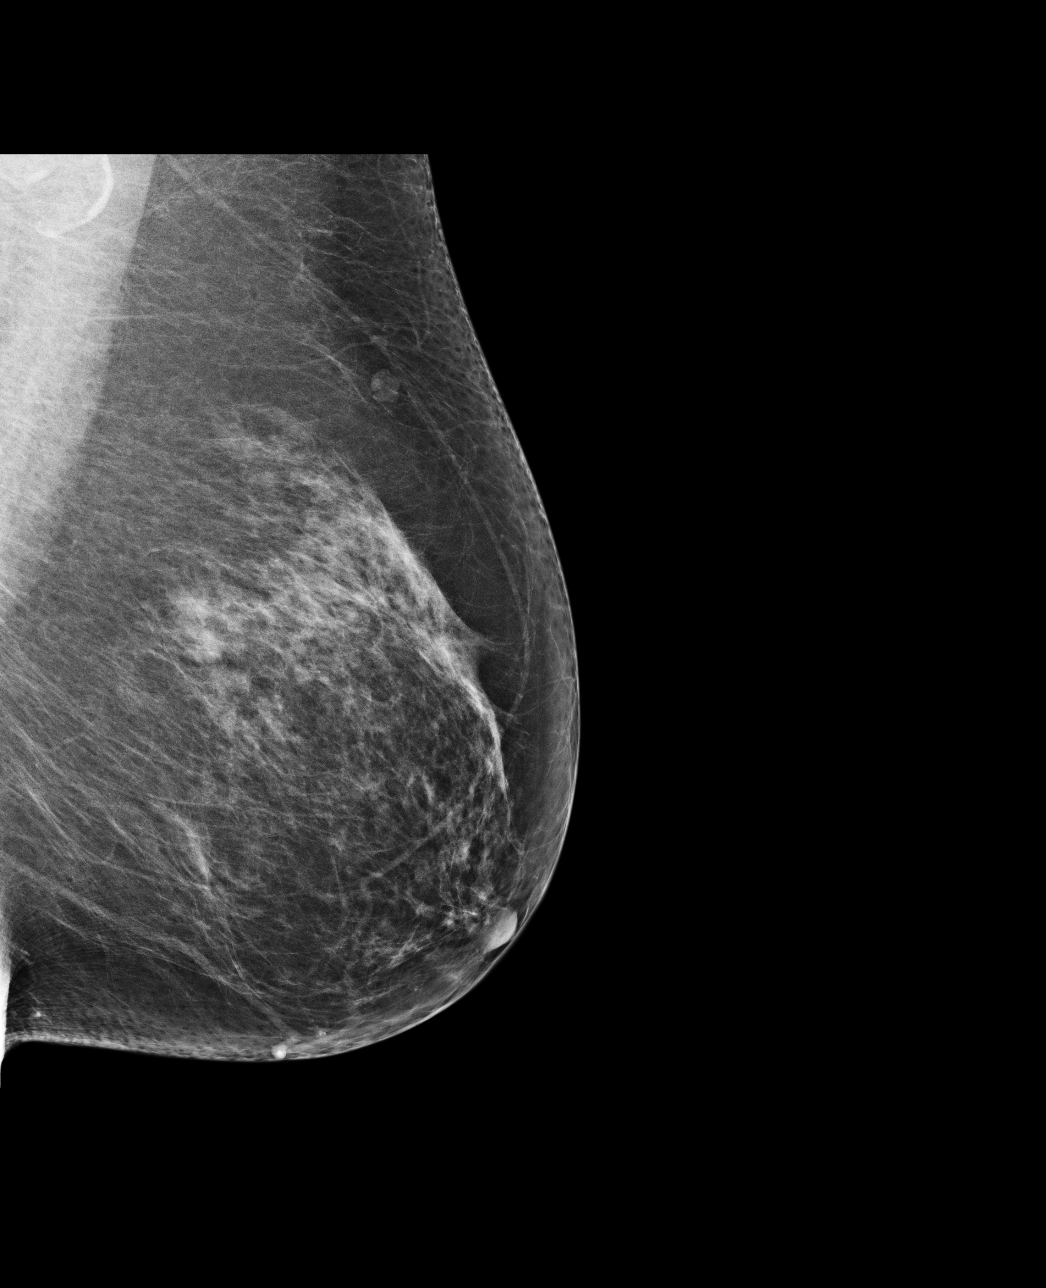

[R CC]
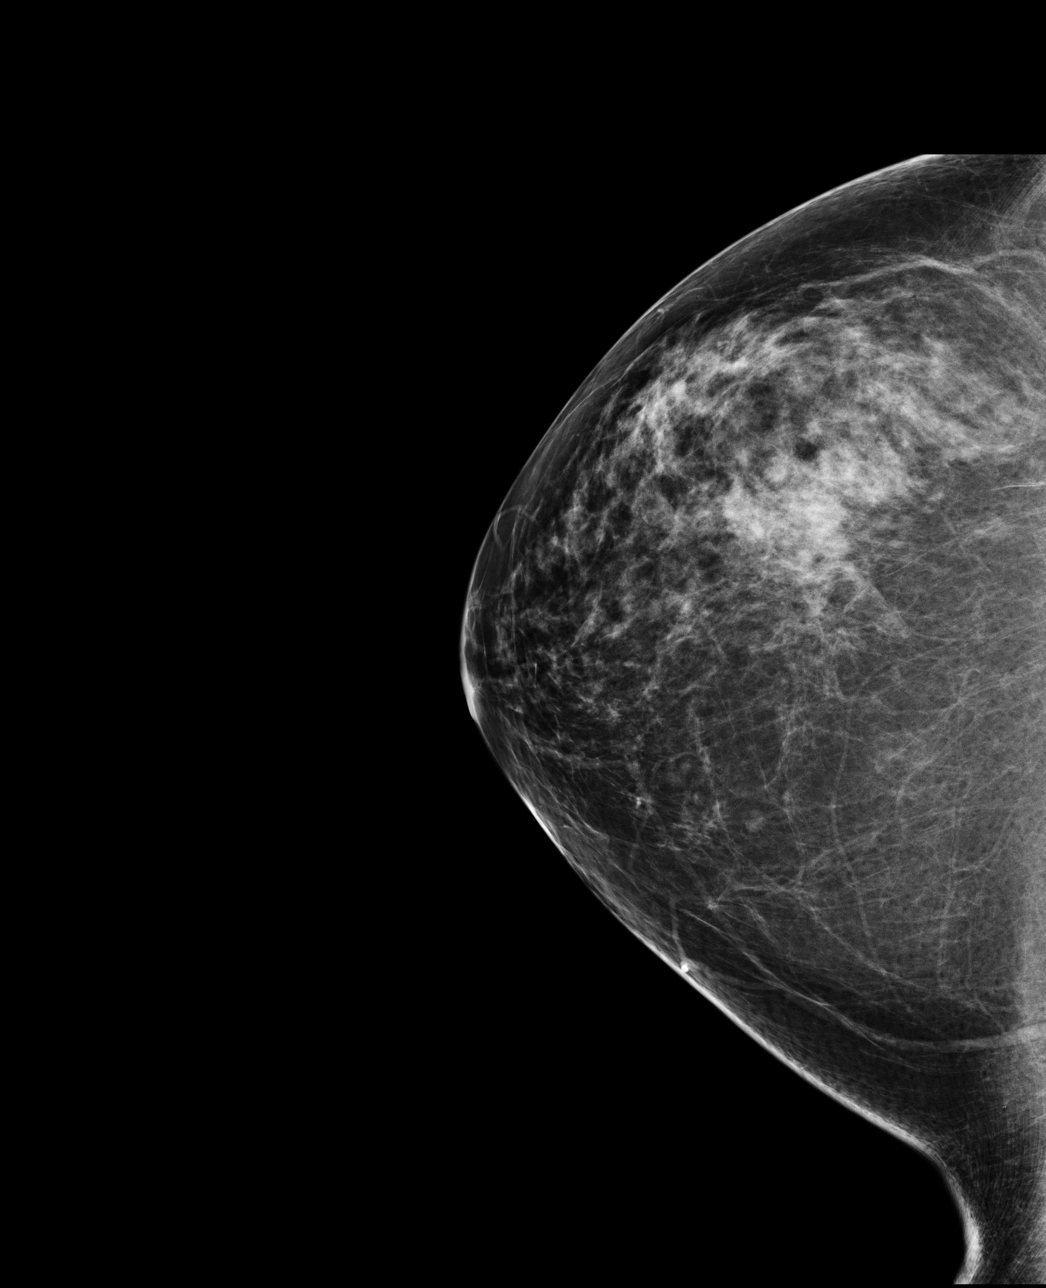

[R MLO]
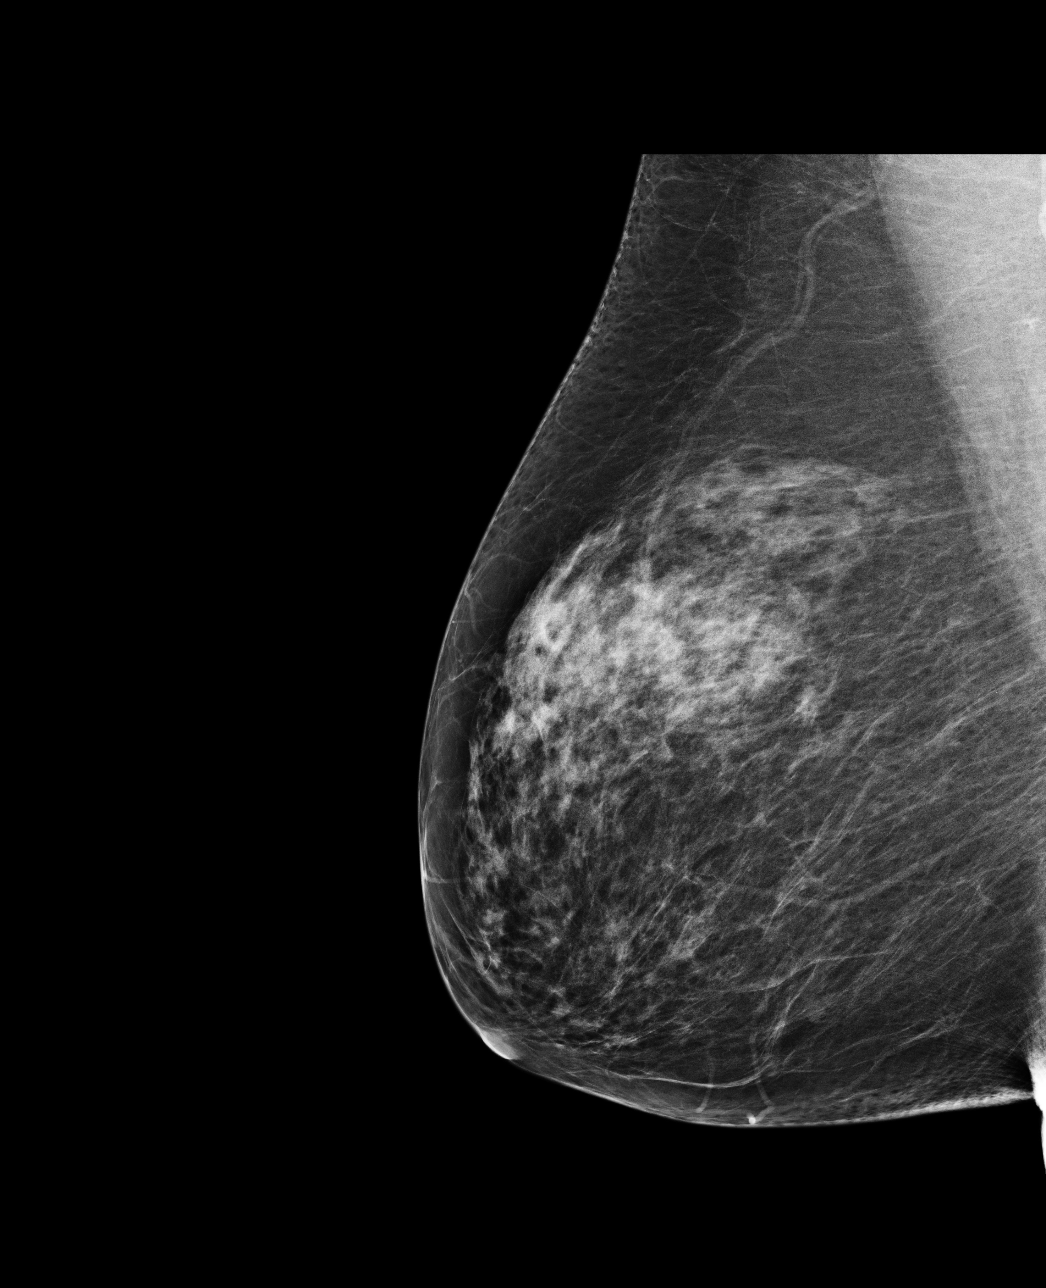

[4 of 4 positions shown; findings below may reference images not displayed]

FINDINGS: ACR Breast Density Category 2: There is a scattered fibroglandular
pattern.

In the left breast, a possible mass warrants further evaluation
with spot compression views and possibly ultrasound.  In the right
breast, no masses or malignant type calcifications are identified.

Images were processed with CAD.
IMPRESSION: Further evaluation is suggested for possible mass in the left
breast.

RECOMMENDATION:
Diagnostic mammogram and possibly ultrasound of the left breast.
(Code:[id number]-[id number])

The patient will be contacted regarding the findings, and
additional imaging will be scheduled.

BI-RADS CATEGORY 0:  Incomplete.  Need additional imaging
evaluation and/or prior mammograms for comparison.

## 2014-04-14 ENCOUNTER — Other Ambulatory Visit (HOSPITAL_COMMUNITY): Payer: Self-pay | Admitting: Family Medicine

## 2014-04-14 DIAGNOSIS — M8580 Other specified disorders of bone density and structure, unspecified site: Secondary | ICD-10-CM

## 2014-04-15 ENCOUNTER — Ambulatory Visit (INDEPENDENT_AMBULATORY_CARE_PROVIDER_SITE_OTHER): Payer: Medicare Other | Admitting: Neurology

## 2014-04-15 ENCOUNTER — Encounter: Payer: Self-pay | Admitting: Neurology

## 2014-04-15 VITALS — BP 153/80 | HR 80 | Temp 98.2°F | Resp 18 | Ht 61.5 in | Wt 198.0 lb

## 2014-04-15 DIAGNOSIS — G478 Other sleep disorders: Secondary | ICD-10-CM

## 2014-04-15 DIAGNOSIS — R351 Nocturia: Secondary | ICD-10-CM | POA: Diagnosis not present

## 2014-04-15 DIAGNOSIS — E669 Obesity, unspecified: Secondary | ICD-10-CM

## 2014-04-15 DIAGNOSIS — G4733 Obstructive sleep apnea (adult) (pediatric): Secondary | ICD-10-CM | POA: Diagnosis not present

## 2014-04-15 NOTE — Patient Instructions (Addendum)
Based on your symptoms and your exam I believe you are at risk for obstructive sleep apnea or OSA, and I think we should proceed with a sleep study to determine whether you do or do not have OSA and how severe it is. If you have more than mild OSA, I want you to consider treatment with CPAP. Please remember, the risks and ramifications of moderate to severe obstructive sleep apnea or OSA are: Cardiovascular disease, including congestive heart failure, stroke, difficult to control hypertension, arrhythmias, and even type 2 diabetes has been linked to untreated OSA. Sleep apnea causes disruption of sleep and sleep deprivation in most cases, which, in turn, can cause recurrent headaches, problems with memory, mood, concentration, focus, and vigilance. Most people with untreated sleep apnea report excessive daytime sleepiness, which can affect their ability to drive. Please do not drive if you feel sleepy.  I will see you back after your sleep study to go over the test results and where to go from there. We will call you after your sleep study and to set up an appointment at the time.   Please remember to try to maintain good sleep hygiene, which means: Keep a regular sleep and wake schedule, try not to exercise or have a meal within 2 hours of your bedtime, try to keep your bedroom conducive for sleep, that is, cool and dark, without light distractors such as an illuminated alarm clock, and refrain from watching TV right before sleep or in the middle of the night and do not keep the TV or radio on during the night. Also, try not to use or play on electronic devices at bedtime, such as your cell phone, tablet PC or laptop. If you like to read at bedtime on an electronic device, try to dim the background light as much as possible. Do not eat in the middle of the night.   Please try to reduce your soda intake and increase your water intake.

## 2014-04-15 NOTE — Progress Notes (Signed)
Subjective:    Patient ID: Maria Morrison is a 69 y.o. female.  HPI     Star Age, MD, PhD [facility] [address].O. [place],  [address]  Dear Aldona Bar,  I saw your patient, Maria Morrison, upon your kind request in my neurologic clinic today for initial consultation of her sleep disorder, in particular, concern for underlying obstructive sleep apnea. The patient is unaccompanied today. As you know, Maria Morrison is a 69 year old right-handed woman with an underlying medical history of reflux disease, hypertension, hyperlipidemia, type 2 diabetes and obesity, who reports snoring, gasping sensations while asleep, witnessed apneas, nocturia and daytime somnolence.  Her typical bedtime is reported to be between 9 PM and 11 PM. She likes to read before falling asleep. She has a TV in the bedroom but does not typically watch it. She does not necessarily keep a good schedule. She takes melatonin 6 mg before going to bed. It helps her go to sleep. She has trouble maintaining sleep and in particular has to get up and use the bathroom 2-3 nights per average night. She denies frank restless leg symptoms but does have knee pain bilaterally, left more than right. Her husband worries about her sleep related breathing disorder because recently she fell asleep and he noticed that she was not breathing regularly but also had a hard time waking her up and that scared him. She has woken herself up with a sense of gasping or her snoring. Snoring is moderate. One of her brothers recently was diagnosed with obstructive sleep apnea and another brother has previously been diagnosed with sleep apnea and uses a CPAP machine. Her usual wake time is around 7 or 7:30 AM and she does not typically feel very rested in the mornings. She does not take any scheduled naps but may fall asleep in the afternoons without planning to. She has never fallen asleep while driving. She may  sleep talk but denies any other parasomnias, she denies sleep paralysis, hypnagogic or hypnopompic hallucinations. She denies morning headaches. She drinks 4 diet Pepper soda cans per day. She drinks decaffeinated tea during the day and decaffeinated coffee. She may not drink enough water she admits. She quit smoking many years ago and was never a heavy smoker. She does not drink any alcohol.  Her Past Medical History Is Significant For: Past Medical History  Diagnosis Date  . GERD (gastroesophageal reflux disease)   . Diabetes mellitus without complication     Her Past Surgical History Is Significant For: Past Surgical History  Procedure Laterality Date  . Appendectomy    . Tubal ligation    . Back surgery    . Colonoscopy N/A 02/25/2014    Procedure: COLONOSCOPY;  Surgeon: Daneil Dolin, MD;  Location: [facility];  Service: Endoscopy;  Laterality: N/A;  9:30 AM    Her Family History Is Significant For: Family History  Problem Relation Age of Onset  . Stroke Mother   . Parkinson's disease Father   . Stroke Maternal Grandfather   . Stroke Paternal Grandfather   . Stroke Paternal Grandmother     Her Social History Is Significant For: History   Social History  . Marital Status: Married    Spouse Name: [name]  . Number of Children: 0  . Years of Education: 13   Occupational History  .      retired   Social History Main Topics  . Smoking status: Never Smoker   .  Smokeless tobacco: Never Used  . Alcohol Use: No  . Drug Use: No  . Sexual Activity: Not on file   Other Topics Concern  . None   Social History Narrative   Consumes 3-4 cans of soda daily    Her Allergies Are:  Allergies  Allergen Reactions  . Codeine Nausea Only  :   Her Current Medications Are:  Outpatient Encounter Prescriptions as of 04/15/2014  Medication Sig  . acetaminophen (TYLENOL) 500 MG tablet Take 1,000 mg by mouth every 6 (six) hours as needed for moderate pain (for back and knee  pain).  Marland Kitchen atorvastatin (LIPITOR) 10 MG tablet Take 10 mg by mouth daily.  Marland Kitchen lisinopril (PRINIVIL,ZESTRIL) 10 MG tablet Take 10 mg by mouth daily.  . Melatonin 3 MG CAPS Take 3 mg by mouth at bedtime.  . metFORMIN (GLUCOPHAGE) 500 MG tablet Take 500 mg by mouth daily with breakfast.  . polyethylene glycol (MIRALAX / GLYCOLAX) packet Take 17 g by mouth every other day.  . [DISCONTINUED] peg 3350 powder (MOVIPREP) 100 G SOLR Take 1 kit (200 g total) by mouth as directed. (Patient not taking: Reported on 04/15/2014)  :  Review of Systems:  Out of a complete 14 point review of systems, all are reviewed and negative with the exception of these symptoms as listed below:  Review of Systems  Constitutional:       Weight gain  Endocrine: Positive for cold intolerance.  Neurological: Positive for dizziness.       Snoring,     Objective:  Neurologic Exam  Physical Exam Physical Examination:   Filed Vitals:   04/15/14 0923  BP: 153/80  Pulse: 80  Temp: 98.2 F (36.8 C)  Resp: 18    General Examination: The patient is a very pleasant 69 y.o. female in no acute distress. She appears well-developed and well-nourished and well groomed.   HEENT: Normocephalic, atraumatic, pupils are equal, round and reactive to light and accommodation. Funduscopic exam is normal with sharp disc margins noted. Extraocular tracking is good without limitation to gaze excursion or nystagmus noted. Normal smooth pursuit is noted. Hearing is grossly intact. Tympanic membranes are clear bilaterally. Face is symmetric with normal facial animation and normal facial sensation. Speech is clear with no dysarthria noted. There is no hypophonia. There is no lip, neck/head, jaw or voice tremor. Neck is supple with full range of passive and active motion. There are no carotid bruits on auscultation. Oropharynx exam reveals: mild mouth dryness, good dental hygiene and moderate airway crowding, due to narrow airway entry, redundant  soft palate and mildly enlarged uvula as well as tonsils of 2+ bilaterally, right a little bigger than left. Mallampati is class III. Neck size is 14-7/8 inches.  Chest: Clear to auscultation without wheezing, rhonchi or crackles noted.  Heart: S1+S2+0, regular and normal without murmurs, rubs or gallops noted.   Abdomen: Soft, non-tender and non-distended with normal bowel sounds appreciated on auscultation.  Extremities: There is no pitting edema in the distal lower extremities bilaterally. Pedal pulses are intact.  Skin: Warm and dry without trophic changes noted. There are no varicose veins.  Musculoskeletal: exam reveals no obvious joint deformities, tenderness or joint swelling or erythema with the exception of left knee tenderness noted. Both knee joints are mildly enlarged..   Neurologically:  Mental status: The patient is awake, alert and oriented in all 4 spheres. Her immediate and remote memory, attention, language skills and fund of knowledge are appropriate. There is no  evidence of aphasia, agnosia, apraxia or anomia. Speech is clear with normal prosody and enunciation. Thought process is linear. Mood is normal and affect is normal.  Cranial nerves II - XII are as described above under HEENT exam. In addition: shoulder shrug is normal with equal shoulder height noted. Motor exam: Normal bulk, strength and tone is noted. There is no drift, tremor or rebound. Romberg is negative. Reflexes are 2+ throughout. Babinski: Toes are flexor bilaterally. Fine motor skills and coordination: intact with normal finger taps, normal hand movements, normal rapid alternating patting, normal foot taps and normal foot agility.  Cerebellar testing: No dysmetria or intention tremor on finger to nose testing. Heel to shin is unremarkable bilaterally. There is no truncal or gait ataxia.  Sensory exam: intact to light touch, pinprick, vibration, temperature sense in the upper and lower extremities.  Gait,  station and balance: She stands easily. No veering to one side is noted. No leaning to one side is noted. Posture is age-appropriate and stance is narrow based. Gait shows normal stride length and normal pace. No problems turning are noted. She turns en bloc. Tandem walk is unremarkable.              Assessment and Plan:  In summary, Jonette Wassel Rojek is a very pleasant 69 y.o.-year old female with an underlying medical history of reflux disease, hypertension, hyperlipidemia, type 2 diabetes and obesity, whose a history and physical exam are in keeping with obstructive sleep apnea (OSA). I had a long chat with the patient about my findings and the diagnosis of OSA, its prognosis and treatment options. We talked about medical treatments, surgical interventions and non-pharmacological approaches. I explained in particular the risks and ramifications of untreated moderate to severe OSA, especially with respect to developing cardiovascular disease down the Road, including congestive heart failure, difficult to treat hypertension, cardiac arrhythmias, or stroke. Even type 2 diabetes has, in part, been linked to untreated OSA. Symptoms of untreated OSA include daytime sleepiness, memory problems, mood irritability and mood disorder such as depression and anxiety, lack of energy, as well as recurrent headaches, especially morning headaches. We talked about trying to maintain a healthy lifestyle in general, as well as the importance of weight control. I encouraged the patient to eat healthy, exercise daily and keep well hydrated, to keep a scheduled bedtime and wake time routine, to not skip any meals and eat healthy snacks in between meals. I advised the patient not to drive when feeling sleepy.she is advised to increase her water intake and decrease her soda intake.  I recommended the following at this time: sleep study with potential positive airway pressure titration. (We will score hypopneas at 4% and split the  sleep study into diagnostic and treatment portion, if the estimated. 2 hour AHI is >15/h).   I explained the sleep test procedure to the patient and also outlined possible surgical and non-surgical treatment options of OSA, including the use of a custom-made dental device (which would require a referral to a specialist dentist or oral surgeon), upper airway surgical options, such as pillar implants, radiofrequency surgery, tongue base surgery, and UPPP (which would involve a referral to an ENT surgeon). Rarely, jaw surgery such as mandibular advancement may be considered.  I also explained the CPAP treatment option to the patient, who indicated that she would be willing to try CPAP if the need arises. I explained the importance of being compliant with PAP treatment, not only for insurance purposes but primarily to  improve Her symptoms, and for the patient's long term health benefit, including to reduce Her cardiovascular risks. I answered all her questions today and the patient was in agreement. I would like to see her back after the sleep study is completed and encouraged her to call with any interim questions, concerns, problems or updates.   Thank you very much for allowing me to participate in the care of this nice patient. If I can be of any further assistance to you please do not hesitate to call me at [phone].  Sincerely,   Star Age, MD, PhD

## 2014-04-16 ENCOUNTER — Ambulatory Visit (HOSPITAL_COMMUNITY)
Admission: RE | Admit: 2014-04-16 | Discharge: 2014-04-16 | Disposition: A | Discharge status: Home / Self Care | Payer: Medicare Other | Source: Ambulatory Visit | Attending: Family Medicine | Admitting: Family Medicine

## 2014-04-16 DIAGNOSIS — M8580 Other specified disorders of bone density and structure, unspecified site: Secondary | ICD-10-CM | POA: Diagnosis not present

## 2014-04-16 DIAGNOSIS — Z780 Asymptomatic menopausal state: Secondary | ICD-10-CM | POA: Diagnosis not present

## 2014-05-11 ENCOUNTER — Ambulatory Visit (INDEPENDENT_AMBULATORY_CARE_PROVIDER_SITE_OTHER): Payer: Medicare Other | Admitting: Neurology

## 2014-05-11 DIAGNOSIS — G4730 Sleep apnea, unspecified: Secondary | ICD-10-CM

## 2014-05-11 DIAGNOSIS — G4733 Obstructive sleep apnea (adult) (pediatric): Secondary | ICD-10-CM | POA: Diagnosis not present

## 2014-05-11 DIAGNOSIS — G479 Sleep disorder, unspecified: Secondary | ICD-10-CM

## 2014-05-11 DIAGNOSIS — G4710 Hypersomnia, unspecified: Secondary | ICD-10-CM

## 2014-05-11 NOTE — Sleep Study (Signed)
Please see the scanned sleep study interpretation located in the Procedure tab within the Chart Review section. 

## 2014-05-22 ENCOUNTER — Telehealth: Payer: Self-pay | Admitting: Neurology

## 2014-05-22 DIAGNOSIS — G4733 Obstructive sleep apnea (adult) (pediatric): Secondary | ICD-10-CM

## 2014-05-22 NOTE — Telephone Encounter (Signed)
Please call and notify the patient that the recent sleep study did confirm the diagnosis of obstructive sleep apnea - moderate to severe - and that I recommend treatment for this in the form of CPAP. This will require a repeat sleep study for proper titration and mask fitting. Please explain to patient and arrange for a CPAP titration study. I have placed an order in the chart. Thanks, Star Age, MD, PhD [facility])

## 2014-05-25 ENCOUNTER — Encounter: Payer: Self-pay | Admitting: Neurology

## 2014-05-25 NOTE — Telephone Encounter (Signed)
Patient was contacted and provided the results of her sleep study which did confirm a diagnosis of OSA.  She was informed that a subsequent CPAP titration study was recommended for treatment.  Patient agreed and scheduled her CPAP study for Tues. April 5th at 8:00 pm.  Delman Cheadle PA-C was faxed a copy of the report.

## 2014-06-05 ENCOUNTER — Ambulatory Visit (INDEPENDENT_AMBULATORY_CARE_PROVIDER_SITE_OTHER): Payer: Medicare Other | Admitting: Neurology

## 2014-06-05 VITALS — BP 120/69 | HR 70 | Ht 61.5 in | Wt 198.0 lb

## 2014-06-05 DIAGNOSIS — G479 Sleep disorder, unspecified: Secondary | ICD-10-CM

## 2014-06-05 DIAGNOSIS — G4733 Obstructive sleep apnea (adult) (pediatric): Secondary | ICD-10-CM

## 2014-06-05 NOTE — Sleep Study (Signed)
See scanned documents in Encounters tab

## 2014-06-19 ENCOUNTER — Telehealth: Payer: Self-pay | Admitting: Neurology

## 2014-06-19 ENCOUNTER — Encounter: Payer: Self-pay | Admitting: Neurology

## 2014-06-19 DIAGNOSIS — G4733 Obstructive sleep apnea (adult) (pediatric): Secondary | ICD-10-CM

## 2014-06-19 NOTE — Telephone Encounter (Signed)
Beverlee Nims:  Please call and inform patient that I have entered an order for treatment with CPAP. She did well during the latest sleep study with CPAP. We will, therefore, arrange for a machine for home use through a DME (durable medical equipment) company of Her choice; and I will see the patient back in follow-up in about 8 to 10 weeks. Please also explain to the patient that I will be looking out for compliance data downloaded from the machine, which can be done remotely through a modem at times or stored on an SD card in the back of the machine. At the time of the followup appointment we will discuss sleep study results and how it is going with PAP treatment at home. Please advise patient to bring Her machine at the time of the visit; at least for the first visit, even though this is cumbersome. Bringing the machine for every visit after that may not be needed, but often helps for the first visit. Please also make sure, the patient has a follow-up appointment with me in about 6 weeks from the setup date, thanks.   David, pls process and then close encounter.  Star Age, MD, PhD [facility])

## 2014-06-23 NOTE — Telephone Encounter (Signed)
Maria Morrison is aware of sleep study results and is ready to proceed with CPAP. She is aware DME company will call with further details.

## 2014-06-29 NOTE — Telephone Encounter (Signed)
Patient was contacted and referred to [facility] for CPAP set up.  Referring MD sent results.

## 2014-07-22 DIAGNOSIS — E782 Mixed hyperlipidemia: Secondary | ICD-10-CM | POA: Diagnosis not present

## 2014-07-22 DIAGNOSIS — E119 Type 2 diabetes mellitus without complications: Secondary | ICD-10-CM | POA: Diagnosis not present

## 2014-07-22 DIAGNOSIS — I10 Essential (primary) hypertension: Secondary | ICD-10-CM | POA: Diagnosis not present

## 2014-07-22 DIAGNOSIS — Z6835 Body mass index (BMI) 35.0-35.9, adult: Secondary | ICD-10-CM | POA: Diagnosis not present

## 2014-07-22 DIAGNOSIS — G4733 Obstructive sleep apnea (adult) (pediatric): Secondary | ICD-10-CM | POA: Diagnosis not present

## 2014-08-06 ENCOUNTER — Encounter: Payer: Self-pay | Admitting: Neurology

## 2014-08-11 ENCOUNTER — Encounter: Payer: Self-pay | Admitting: Neurology

## 2014-08-12 ENCOUNTER — Encounter: Payer: Self-pay | Admitting: Neurology

## 2014-08-12 ENCOUNTER — Ambulatory Visit (INDEPENDENT_AMBULATORY_CARE_PROVIDER_SITE_OTHER): Payer: Medicare Other | Admitting: Neurology

## 2014-08-12 VITALS — BP 124/68 | HR 68 | Resp 16 | Ht 61.5 in | Wt 195.0 lb

## 2014-08-12 DIAGNOSIS — G4733 Obstructive sleep apnea (adult) (pediatric): Secondary | ICD-10-CM | POA: Diagnosis not present

## 2014-08-12 DIAGNOSIS — Z9989 Dependence on other enabling machines and devices: Principal | ICD-10-CM

## 2014-08-12 NOTE — Progress Notes (Signed)
Subjective:    Patient ID: Maria Morrison is a 69 y.o. female.  HPI    Interim history:   Maria Morrison is a 69 year old right-handed woman with an underlying medical history of reflux disease, hypertension, hyperlipidemia, type 2 diabetes and obesity, who presents for follow-up consultation of her sleep disorder, in particular her obstructive sleep apnea, after her recent sleep studies. The patient is unaccompanied today. I first met her on 04/15/2014 at the request of her primary care provider, at which time the patient reported snoring, witnessed apneas, excessive daytime somnolence and nocturia. I invited her back for sleep study. She had a baseline sleep study, followed by a CPAP titration study. I went over her test results with her in detail today. Her baseline sleep study from 05/11/2014 showed a sleep efficiency of 64.2% with a latency to sleep of 67 minutes and wake after sleep onset of 102 minutes with moderate sleep fragmentation noted. She had an elevated arousal index. She had an increased percentage of stage II sleep, and a decreased percentage of REM sleep with a normal REM latency. She had no significant PLMS. She had no significant EKG or EEG changes. She had mild to moderate snoring. Total AHI was 16.4 per hour, rising to 49.7 per hour during REM sleep and 24.5 per hour in the supine position. Baseline oxygen saturation was 91%, nadir was 74% during REM sleep. Based on the test results I invited her back for a full night CPAP titration study. She had this on 06/05/2014: Sleep efficiency was 76.5% with a latency to sleep normal at 7 minutes and wake after sleep onset of 97 minutes with moderate sleep fragmentation noted. She had a mildly elevated arousal index. She had a mildly increased percentage of stage II sleep, a normal percentage of slow-wave sleep and a mildly decreased percentage of REM sleep with a prolonged REM latency. She had no significant PLMS. She had no significant EKG or  EEG changes. CPAP was titrated from 5-12 cm. On a pressure of 11 cm her AHI was 3 per hour. Average oxygen saturation was 94%, nadir was 91%. Based on the test results I prescribed CPAP therapy for home use.  Today, 08/12/2014: I reviewed her CPAP compliance data from 07/12/2014 through 08/10/2014 which is a total of 30 days during which time she used her machine every day with percent used days greater than 4 hours at 100%, indicating superb compliance with an average usage of 6 hours and 41 minutes, residual AHI suboptimal at 13.7 per hour, leak acceptable for the most part with the 95th percentile at 17.8 L/m on a pressure of 11 cm with EPR of 3.  Today, 08/12/2014: She reports, that she is still adjusting to treatment. Sometimes her mask feels uncomfortable. She was just contacted by her DME company recently regarding filters for her machine. She was not told about filters before and is not sure how to change them. She wakes up with dry mouth sometimes. Overall, in terms of positive response to CPAP she feels perhaps a little bit better rested and less sleepy during the day. She has noted that she does not take a nap necessarily. She still has nocturia. She has no new symptoms. She had no recent medication changes.  Previously:  Her typical bedtime is reported to be between 9 PM and 11 PM. She likes to read before falling asleep. She has a TV in the bedroom but does not typically watch it. She does not necessarily keep a  good schedule. She takes melatonin 6 mg before going to bed. It helps her go to sleep. She has trouble maintaining sleep and in particular has to get up and use the bathroom 2-3 nights per average night. She denies frank restless leg symptoms but does have knee pain bilaterally, left more than right. Her husband worries about her sleep related breathing disorder because recently she fell asleep and he noticed that she was not breathing regularly but also had a hard time waking her up and  that scared him. She has woken herself up with a sense of gasping or her snoring. Snoring is moderate. One of her brothers recently was diagnosed with obstructive sleep apnea and another brother has previously been diagnosed with sleep apnea and uses a CPAP machine. Her usual wake time is around 7 or 7:30 AM and she does not typically feel very rested in the mornings. She does not take any scheduled naps but may fall asleep in the afternoons without planning to. She has never fallen asleep while driving. She may sleep talk but denies any other parasomnias, she denies sleep paralysis, hypnagogic or hypnopompic hallucinations. She denies morning headaches. She drinks 4 diet Pepper soda cans per day. She drinks decaffeinated tea during the day and decaffeinated coffee. She may not drink enough water she admits. She quit smoking many years ago and was never a heavy smoker. She does not drink any alcohol.  Her Past Medical History Is Significant For: Past Medical History  Diagnosis Date  . GERD (gastroesophageal reflux disease)   . Diabetes mellitus without complication     Her Past Surgical History Is Significant For: Past Surgical History  Procedure Laterality Date  . Appendectomy    . Tubal ligation    . Back surgery    . Colonoscopy N/A 02/25/2014    Procedure: COLONOSCOPY;  Surgeon: Daneil Dolin, MD;  Location: [facility];  Service: Endoscopy;  Laterality: N/A;  9:30 AM    Her Family History Is Significant For: Family History  Problem Relation Age of Onset  . Stroke Mother   . Parkinson's disease Father   . Stroke Maternal Grandfather   . Stroke Paternal Grandfather   . Stroke Paternal Grandmother     Her Social History Is Significant For: History   Social History  . Marital Status: Married    Spouse Name: [name]  . Number of Children: 0  . Years of Education: 13   Occupational History  .      retired   Social History Main Topics  . Smoking status: Never Smoker   .  Smokeless tobacco: Never Used  . Alcohol Use: No  . Drug Use: No  . Sexual Activity: Not on file   Other Topics Concern  . None   Social History Narrative   Consumes 3-4 cans of soda daily    Her Allergies Are:  Allergies  Allergen Reactions  . Codeine Nausea Only  :   Her Current Medications Are:  Outpatient Encounter Prescriptions as of 08/12/2014  Medication Sig  . acetaminophen (TYLENOL) 500 MG tablet Take 1,000 mg by mouth every 6 (six) hours as needed for moderate pain (for back and knee pain).  Marland Kitchen atorvastatin (LIPITOR) 10 MG tablet Take 10 mg by mouth daily.  Marland Kitchen lisinopril (PRINIVIL,ZESTRIL) 10 MG tablet Take 10 mg by mouth daily.  . Melatonin 3 MG CAPS Take 3 mg by mouth at bedtime.  . metFORMIN (GLUCOPHAGE) 500 MG tablet Take 500 mg by mouth  daily with breakfast.  . polyethylene glycol (MIRALAX / GLYCOLAX) packet Take 17 g by mouth every other day.   No facility-administered encounter medications on file as of 08/12/2014.  :  Review of Systems:  Out of a complete 14 point review of systems, all are reviewed and negative with the exception of these symptoms as listed below:   Review of Systems  Neurological:       Patient feels that her CPAP mask is irritating.   All other systems reviewed and are negative.   Objective:  Neurologic Exam  Physical Exam Physical Examination:   Filed Vitals:   08/12/14 1257  BP: 124/68  Pulse: 68  Resp: 16    General Examination: The patient is a very pleasant 69 y.o. female in no acute distress. She appears well-developed and well-nourished and very well groomed.   HEENT: Normocephalic, atraumatic, pupils are equal, round and reactive to light and accommodation. Funduscopic exam is normal with sharp disc margins noted. Extraocular tracking is good without limitation to gaze excursion or nystagmus noted. Normal smooth pursuit is noted. Hearing is grossly intact. Face is symmetric with normal facial animation and normal facial  sensation. Speech is clear with no dysarthria noted. There is no hypophonia. There is no lip, neck/head, jaw or voice tremor. Neck is supple with full range of passive and active motion. There are no carotid bruits on auscultation. Oropharynx exam reveals: mild mouth dryness, good dental hygiene and moderate airway crowding, due to narrow airway entry, redundant soft palate and mildly enlarged uvula as well as tonsils of 2+ bilaterally, right a little bigger than left. Mallampati is class III.   Chest: Clear to auscultation without wheezing, rhonchi or crackles noted.  Heart: S1+S2+0, regular and normal without murmurs, rubs or gallops noted.   Abdomen: Soft, non-tender and non-distended with normal bowel sounds appreciated on auscultation.  Extremities: There is no pitting edema in the distal lower extremities bilaterally. Pedal pulses are intact.  Skin: Warm and dry without trophic changes noted. There are mild varicose veins, and she has spider veins in her distal legs..  Musculoskeletal: exam reveals no obvious joint deformities, tenderness or joint swelling or erythema with the exception of left knee tenderness noted. Both knee joints are mildly enlarged..   Neurologically:  Mental status: The patient is awake, alert and oriented in all 4 spheres. Her immediate and remote memory, attention, language skills and fund of knowledge are appropriate. There is no evidence of aphasia, agnosia, apraxia or anomia. Speech is clear with normal prosody and enunciation. Thought process is linear. Mood is normal and affect is normal.  Cranial nerves II - XII are as described above under HEENT exam. In addition: shoulder shrug is normal with equal shoulder height noted. Motor exam: Normal bulk, strength and tone is noted. There is no drift, tremor or rebound. Romberg is negative. Reflexes are 2+ throughout. Fine motor skills and coordination: intact with normal finger taps, normal hand movements, normal rapid  alternating patting, normal foot taps and normal foot agility.  Cerebellar testing: No dysmetria or intention tremor on finger to nose testing. Sensory exam: intact to light touch in the upper and lower extremities.  Gait, station and balance: She stands easily. No veering to one side is noted. No leaning to one side is noted. Posture is age-appropriate and stance is narrow based. Gait shows normal stride length and normal pace. No problems turning are noted. She turns en bloc.  Assessment and Plan:  In summary, Dylan Ruotolo Schindler is a very pleasant 69 year old female with an underlying medical history of reflux disease, hypertension, hyperlipidemia, type 2 diabetes and obesity, who presents for follow-up consultation of her moderate obstructive sleep apnea. She underwent 2 sleep studies recently, a baseline sleep study in March and a CPAP titration study in April. Today, we talked about her sleep study results in detail and I also explained her compliance data to her. She is congratulated on her full compliance. Residual AHI is suboptimal at this time. I would like to get a breakdown of the AHI in terms of obstructive versus central residual sleep disordered breathing. Of note, the patient states that she sometimes coughs at night. Her coughing may be interpreted by the machine as an apneic events. She also has some questions about her machine and how to change the filter. We will get in touch with her DME company for her. She does endorse some improvement of her sleep, including her daytime somnolence. She is advised to continue with treatment at the current setting.  I explained the importance of being compliant with PAP treatment, not only for insurance purposes but primarily to improve Her symptoms, and for the patient's long term health benefit, including to reduce Her cardiovascular risks. I answered all her questions today and the patient was in agreement. I would like to see her back in  about 3 months, sooner if needed.  I spent 25 minutes in total face-to-face time with the patient, more than 50% of which was spent in counseling and coordination of care, reviewing test results, reviewing medication and discussing or reviewing the diagnosis of OSA, its prognosis and treatment options.

## 2014-08-12 NOTE — Patient Instructions (Signed)
Please continue using your CPAP regularly. While your insurance requires that you use CPAP at least 4 hours each night on 70% of the nights, I recommend, that you not skip any nights and use it throughout the night if you can. Getting used to CPAP and staying with the treatment long term does take time and patience and discipline. Untreated obstructive sleep apnea when it is moderate to severe can have an adverse impact on cardiovascular health and raise her risk for heart disease, arrhythmias, hypertension, congestive heart failure, stroke and diabetes. Untreated obstructive sleep apnea causes sleep disruption, nonrestorative sleep, and sleep deprivation. This can have an impact on your day to day functioning and cause daytime sleepiness and impairment of cognitive function, memory loss, mood disturbance, and problems focussing. Using CPAP regularly can improve these symptoms.  Keep up the good work! I will see you back in 3 months for sleep apnea check up.

## 2014-08-17 ENCOUNTER — Telehealth: Payer: Self-pay | Admitting: Neurology

## 2014-08-17 DIAGNOSIS — G4733 Obstructive sleep apnea (adult) (pediatric): Secondary | ICD-10-CM

## 2014-08-17 NOTE — Telephone Encounter (Signed)
DME rep recommends trial of AutoPAP x 1 week, pls fax order to [facility], thx

## 2014-08-18 NOTE — Telephone Encounter (Signed)
Faxed to [facility]  

## 2014-09-30 ENCOUNTER — Encounter: Payer: Self-pay | Admitting: Neurology

## 2014-11-18 ENCOUNTER — Encounter: Payer: Self-pay | Admitting: Neurology

## 2014-11-18 ENCOUNTER — Ambulatory Visit (INDEPENDENT_AMBULATORY_CARE_PROVIDER_SITE_OTHER): Payer: Medicare Other | Admitting: Neurology

## 2014-11-18 VITALS — BP 132/74 | HR 70 | Resp 16 | Ht 61.5 in | Wt 200.0 lb

## 2014-11-18 DIAGNOSIS — G4733 Obstructive sleep apnea (adult) (pediatric): Secondary | ICD-10-CM

## 2014-11-18 DIAGNOSIS — E669 Obesity, unspecified: Secondary | ICD-10-CM | POA: Diagnosis not present

## 2014-11-18 DIAGNOSIS — Z9989 Dependence on other enabling machines and devices: Principal | ICD-10-CM

## 2014-11-18 NOTE — Patient Instructions (Signed)
Please continue using your CPAP regularly. While your insurance requires that you use CPAP at least 4 hours each night on 70% of the nights, I recommend, that you not skip any nights and use it throughout the night if you can. Getting used to CPAP and staying with the treatment long term does take time and patience and discipline. Untreated obstructive sleep apnea when it is moderate to severe can have an adverse impact on cardiovascular health and raise her risk for heart disease, arrhythmias, hypertension, congestive heart failure, stroke and diabetes. Untreated obstructive sleep apnea causes sleep disruption, nonrestorative sleep, and sleep deprivation. This can have an impact on your day to day functioning and cause daytime sleepiness and impairment of cognitive function, memory loss, mood disturbance, and problems focussing. Using CPAP regularly can improve these symptoms.  Keep up the good work! I will see you back in 6 months for sleep apnea check up, and if you continue to do well on CPAP I will see you once a year thereafter.   For nasal or nostril soreness/irritation, use a little bit vaseline during the day, not at night or right before using the CPAP mask.   Try to lose weight and drink more water.

## 2014-11-18 NOTE — Progress Notes (Signed)
Subjective:    Patient ID: Maria Morrison is a 69 y.o. female.  HPI     Interim history:  Maria Morrison is a 69 year old right-handed woman with an underlying medical history of reflux disease, hypertension, hyperlipidemia, type 2 diabetes and obesity, who presents for follow-up consultation of her obstructive sleep apnea, on treatment with CPAP therapy. The patient is unaccompanied today.I last saw her on 08/12/2014, at which time she reported still adjusting to CPAP treatment. We talked about his sleep test results at the time. She felt some discomfort with her mask. She was unsure how to change the filters in the machine. She was complaining of dry mouth. Overall, she felt she had a reasonably positive experience and response to CPAP and felt a little better rested and less sleepy during the day. She noted that she did not have to take a nap. She still had nocturia.  Today, 11/18/2014: I reviewed her CPAP compliance data from 10/18/2014 through 11/16/2014 which is a total of 30 days during which time she used her machine every night with percent used days greater than 4 hours at 93%, indicating excellent compliance with an average usage of 6 hours and 22 minutes, residual AHI at 2.2 per hour, leak low with the 95th percentile at 9.1 L/m on a pressure of 11 cm with EPR of 3.  Today, 11/18/2014: She reports doing well. She is tolerating the pressure and the mask. She likes the nasal pillows. Sometimes she has a sore area around her right nostril. She has been using Neosporin on it. Overall, looking back, she feels improved in her sleep since starting CPAP therapy. She had no recent medical illness or medication changes and has a primary care appointment in November. She needs to get her eyes checked and is due for an appointment.  Previously:  I first met her on 04/15/2014 at the request of her primary care provider, at which time the patient reported snoring, witnessed apneas, excessive daytime  somnolence and nocturia. I invited her back for sleep study. She had a baseline sleep study, followed by a CPAP titration study. I went over her test results with her in detail today. Her baseline sleep study from 05/11/2014 showed a sleep efficiency of 64.2% with a latency to sleep of 67 minutes and wake after sleep onset of 102 minutes with moderate sleep fragmentation noted. She had an elevated arousal index. She had an increased percentage of stage II sleep, and a decreased percentage of REM sleep with a normal REM latency. She had no significant PLMS. She had no significant EKG or EEG changes. She had mild to moderate snoring. Total AHI was 16.4 per hour, rising to 49.7 per hour during REM sleep and 24.5 per hour in the supine position. Baseline oxygen saturation was 91%, nadir was 74% during REM sleep. Based on the test results I invited her back for a full night CPAP titration study. She had this on 06/05/2014: Sleep efficiency was 76.5% with a latency to sleep normal at 7 minutes and wake after sleep onset of 97 minutes with moderate sleep fragmentation noted. She had a mildly elevated arousal index. She had a mildly increased percentage of stage II sleep, a normal percentage of slow-wave sleep and a mildly decreased percentage of REM sleep with a prolonged REM latency. She had no significant PLMS. She had no significant EKG or EEG changes. CPAP was titrated from 5-12 cm. On a pressure of 11 cm her AHI was 3 per hour. Average  oxygen saturation was 94%, nadir was 91%. Based on the test results I prescribed CPAP therapy for home use.   I reviewed her CPAP compliance data from 07/12/2014 through 08/10/2014 which is a total of 30 days during which time she used her machine every day with percent used days greater than 4 hours at 100%, indicating superb compliance with an average usage of 6 hours and 41 minutes, residual AHI suboptimal at 13.7 per hour, leak acceptable for the most part with the 95th  percentile at 17.8 L/m on a pressure of 11 cm with EPR of 3.   Her typical bedtime is reported to be between 9 PM and 11 PM. She likes to read before falling asleep. She has a TV in the bedroom but does not typically watch it. She does not necessarily keep a good schedule. She takes melatonin 6 mg before going to bed. It helps her go to sleep. She has trouble maintaining sleep and in particular has to get up and use the bathroom 2-3 nights per average night. She denies frank restless leg symptoms but does have knee pain bilaterally, left more than right. Her husband worries about her sleep related breathing disorder because recently she fell asleep and he noticed that she was not breathing regularly but also had a hard time waking her up and that scared him. She has woken herself up with a sense of gasping or her snoring. Snoring is moderate. One of her brothers recently was diagnosed with obstructive sleep apnea and another brother has previously been diagnosed with sleep apnea and uses a CPAP machine. Her usual wake time is around 7 or 7:30 AM and she does not typically feel very rested in the mornings. She does not take any scheduled naps but may fall asleep in the afternoons without planning to. She has never fallen asleep while driving. She may sleep talk but denies any other parasomnias, she denies sleep paralysis, hypnagogic or hypnopompic hallucinations. She denies morning headaches. She drinks 4 diet Pepper soda cans per day. She drinks decaffeinated tea during the day and decaffeinated coffee. She may not drink enough water she admits. She quit smoking many years ago and was never a heavy smoker. She does not drink any alcohol.  Her Past Medical History Is Significant For: Past Medical History  Diagnosis Date  . GERD (gastroesophageal reflux disease)   . Diabetes mellitus without complication     Her Past Surgical History Is Significant For: Past Surgical History  Procedure Laterality Date   . Appendectomy    . Tubal ligation    . Back surgery    . Colonoscopy N/A 02/25/2014    Procedure: COLONOSCOPY;  Surgeon: Daneil Dolin, MD;  Location: [facility];  Service: Endoscopy;  Laterality: N/A;  9:30 AM    Her Family History Is Significant For: Family History  Problem Relation Age of Onset  . Stroke Mother   . Parkinson's disease Father   . Stroke Maternal Grandfather   . Stroke Paternal Grandfather   . Stroke Paternal Grandmother     Her Social History Is Significant For: Social History   Social History  . Marital Status: Married    Spouse Name: [name]  . Number of Children: 0  . Years of Education: 13   Occupational History  .      retired   Social History Main Topics  . Smoking status: Never Smoker   . Smokeless tobacco: Never Used  . Alcohol Use: No  .  Drug Use: No  . Sexual Activity: Not Asked   Other Topics Concern  . None   Social History Narrative   Consumes 3-4 cans of soda daily    Her Allergies Are:  Allergies  Allergen Reactions  . Codeine Nausea Only  :   Her Current Medications Are:  Outpatient Encounter Prescriptions as of 11/18/2014  Medication Sig  . acetaminophen (TYLENOL) 500 MG tablet Take 1,000 mg by mouth every 6 (six) hours as needed for moderate pain (for back and knee pain).  Marland Kitchen atorvastatin (LIPITOR) 10 MG tablet Take 10 mg by mouth daily.  Marland Kitchen lisinopril (PRINIVIL,ZESTRIL) 10 MG tablet Take 10 mg by mouth daily.  . Melatonin 3 MG CAPS Take 3 mg by mouth at bedtime.  . metFORMIN (GLUCOPHAGE) 500 MG tablet Take 500 mg by mouth daily with breakfast.  . polyethylene glycol (MIRALAX / GLYCOLAX) packet Take 17 g by mouth every other day.   No facility-administered encounter medications on file as of 11/18/2014.  :  Review of Systems:  Out of a complete 14 point review of systems, all are reviewed and negative with the exception of these symptoms as listed below:   Review of Systems  Neurological:       Patient states  that she is doing well on CPAP. She has switched to nasal pillows and likes this better than mask. It is causing a sore in the R side of her nose and is looking for some advice for this.   All other systems reviewed and are negative.   Objective:  Neurologic Exam  Physical Exam Physical Examination:   Filed Vitals:   11/18/14 1249  BP: 132/74  Pulse: 70  Resp: 16    General Examination: The patient is a very pleasant 69 y.o. female in no acute distress. She appears well-developed and well-nourished and very well groomed.   HEENT: Normocephalic, atraumatic, pupils are equal, round and reactive to light and accommodation. Extraocular tracking is good without limitation to gaze excursion or nystagmus noted. Normal smooth pursuit is noted. Hearing is grossly intact. Face is symmetric with normal facial animation and normal facial sensation. Speech is clear with no dysarthria noted. There is no hypophonia. There is no lip, neck/head, jaw or voice tremor. Neck is supple with full range of passive and active motion. There are no carotid bruits on auscultation. Oropharynx exam reveals: mild mouth dryness, good dental hygiene and moderate airway crowding, due to narrow airway entry, redundant soft palate and mildly enlarged uvula as well as tonsils of 2+ bilaterally, right a little bigger than left. Mallampati is class III. she has no significant redness or sore area around her nostrils bilaterally.  Chest: Clear to auscultation without wheezing, rhonchi or crackles noted.  Heart: S1+S2+0, regular and normal without murmurs, rubs or gallops noted.   Abdomen: Soft, non-tender and non-distended with normal bowel sounds appreciated on auscultation.  Extremities: There is no pitting edema in the distal lower extremities bilaterally. Pedal pulses are intact.  Skin: Warm and dry without trophic changes noted. There are mild varicose veins, and she has spider veins in her distal  legs..  Musculoskeletal: exam reveals no obvious joint deformities, tenderness or joint swelling or erythema with the exception of left knee tenderness noted. Both knee joints are mildly enlarged..   Neurologically:  Mental status: The patient is awake, alert and oriented in all 4 spheres. Her immediate and remote memory, attention, language skills and fund of knowledge are appropriate. There is no evidence  of aphasia, agnosia, apraxia or anomia. Speech is clear with normal prosody and enunciation. Thought process is linear. Mood is normal and affect is normal.  Cranial nerves II - XII are as described above under HEENT exam. In addition: shoulder shrug is normal with equal shoulder height noted. Motor exam: Normal bulk, strength and tone is noted. There is no drift, tremor or rebound. Romberg is negative. Reflexes are 2+ throughout. Fine motor skills and coordination: intact with normal finger taps, normal hand movements, normal rapid alternating patting, normal foot taps and normal foot agility.  Cerebellar testing: No dysmetria or intention tremor on finger to nose testing. Sensory exam: intact to light touch, PP, temperature and vibration in the upper and lower extremities.  Gait, station and balance: She stands easily. No veering to one side is noted. No leaning to one side is noted. Posture is age-appropriate and stance is narrow based. Gait shows normal stride length and normal pace. No problems turning are noted. She turns en bloc. Tandem walk is unremarkable.            Assessment and Plan:  In summary, Layan Zalenski Little is a very pleasant 69 year old female with an underlying medical history of reflux disease, hypertension, hyperlipidemia, type 2 diabetes and obesity, who presents for follow-up consultation of her moderate obstructive sleep apnea. She underwent 2 sleep studies recently, a baseline sleep study in March and a CPAP titration study in April. Today, we talked about most recent  compliance data and she is fully compliant with treatment. She is commended for this. She has gained a little bit of weight in the last few months but overall 20 pounds since December of last year. She is encouraged to drink more water and reduce her soda and tea intake. She is furthermore advised to try to lose weight. For nasal soreness or nostril irritation, she is advised to use a little bit of Vaseline during the day, not right before using the CPAP mask of course.  Her physical exam is stable otherwise.  She does endorse some improvement of her sleep, including her daytime somnolence. She is advised to continue with treatment at the current setting.  I explained the importance of being compliant with PAP treatment, not only for insurance purposes but primarily to improve Her symptoms, and for the patient's long term health benefit, including to reduce Her cardiovascular risks. I would like to see her back in 6 months, sooner if the need arises. I answered all her questions today and the patient was in agreement.  I spent 15 minutes in total face-to-face time with the patient, more than 50% of which was spent in counseling and coordination of care, reviewing test results, reviewing medication and discussing or reviewing the diagnosis of OSA, its prognosis and treatment options.

## 2014-12-23 DIAGNOSIS — Z23 Encounter for immunization: Secondary | ICD-10-CM | POA: Diagnosis not present

## 2015-01-27 DIAGNOSIS — E1129 Type 2 diabetes mellitus with other diabetic kidney complication: Secondary | ICD-10-CM | POA: Diagnosis not present

## 2015-01-27 DIAGNOSIS — Z1389 Encounter for screening for other disorder: Secondary | ICD-10-CM | POA: Diagnosis not present

## 2015-01-27 DIAGNOSIS — I10 Essential (primary) hypertension: Secondary | ICD-10-CM | POA: Diagnosis not present

## 2015-01-27 DIAGNOSIS — Z6837 Body mass index (BMI) 37.0-37.9, adult: Secondary | ICD-10-CM | POA: Diagnosis not present

## 2015-01-27 DIAGNOSIS — E782 Mixed hyperlipidemia: Secondary | ICD-10-CM | POA: Diagnosis not present

## 2015-02-08 DIAGNOSIS — Z1389 Encounter for screening for other disorder: Secondary | ICD-10-CM | POA: Diagnosis not present

## 2015-02-08 DIAGNOSIS — E782 Mixed hyperlipidemia: Secondary | ICD-10-CM | POA: Diagnosis not present

## 2015-02-08 DIAGNOSIS — Z6837 Body mass index (BMI) 37.0-37.9, adult: Secondary | ICD-10-CM | POA: Diagnosis not present

## 2015-05-18 ENCOUNTER — Ambulatory Visit: Payer: Medicare Other | Admitting: Neurology

## 2015-06-07 DIAGNOSIS — E1129 Type 2 diabetes mellitus with other diabetic kidney complication: Secondary | ICD-10-CM | POA: Diagnosis not present

## 2015-06-07 DIAGNOSIS — Z1389 Encounter for screening for other disorder: Secondary | ICD-10-CM | POA: Diagnosis not present

## 2015-06-07 DIAGNOSIS — Z0000 Encounter for general adult medical examination without abnormal findings: Secondary | ICD-10-CM | POA: Diagnosis not present

## 2015-06-07 DIAGNOSIS — Z6839 Body mass index (BMI) 39.0-39.9, adult: Secondary | ICD-10-CM | POA: Diagnosis not present

## 2015-07-16 ENCOUNTER — Other Ambulatory Visit (HOSPITAL_COMMUNITY): Payer: Self-pay | Admitting: Family Medicine

## 2015-07-16 DIAGNOSIS — Z1231 Encounter for screening mammogram for malignant neoplasm of breast: Secondary | ICD-10-CM

## 2015-07-19 ENCOUNTER — Ambulatory Visit (HOSPITAL_COMMUNITY)
Admission: RE | Admit: 2015-07-19 | Discharge: 2015-07-19 | Disposition: A | Discharge status: Home / Self Care | Payer: Medicare Other | Source: Ambulatory Visit | Attending: Family Medicine | Admitting: Family Medicine

## 2015-07-19 DIAGNOSIS — Z1231 Encounter for screening mammogram for malignant neoplasm of breast: Secondary | ICD-10-CM | POA: Diagnosis not present

## 2015-07-21 DIAGNOSIS — H35462 Secondary vitreoretinal degeneration, left eye: Secondary | ICD-10-CM | POA: Diagnosis not present

## 2015-07-21 DIAGNOSIS — H52223 Regular astigmatism, bilateral: Secondary | ICD-10-CM | POA: Diagnosis not present

## 2015-07-21 DIAGNOSIS — H5203 Hypermetropia, bilateral: Secondary | ICD-10-CM | POA: Diagnosis not present

## 2015-07-21 DIAGNOSIS — H524 Presbyopia: Secondary | ICD-10-CM | POA: Diagnosis not present

## 2015-09-24 DIAGNOSIS — I10 Essential (primary) hypertension: Secondary | ICD-10-CM | POA: Diagnosis not present

## 2015-09-24 DIAGNOSIS — Z6837 Body mass index (BMI) 37.0-37.9, adult: Secondary | ICD-10-CM | POA: Diagnosis not present

## 2015-09-24 DIAGNOSIS — E119 Type 2 diabetes mellitus without complications: Secondary | ICD-10-CM | POA: Diagnosis not present

## 2015-09-24 DIAGNOSIS — E782 Mixed hyperlipidemia: Secondary | ICD-10-CM | POA: Diagnosis not present

## 2015-10-04 DIAGNOSIS — Z1389 Encounter for screening for other disorder: Secondary | ICD-10-CM | POA: Diagnosis not present

## 2015-10-04 DIAGNOSIS — Z6837 Body mass index (BMI) 37.0-37.9, adult: Secondary | ICD-10-CM | POA: Diagnosis not present

## 2015-10-04 DIAGNOSIS — E119 Type 2 diabetes mellitus without complications: Secondary | ICD-10-CM | POA: Diagnosis not present

## 2015-12-15 DIAGNOSIS — Z23 Encounter for immunization: Secondary | ICD-10-CM | POA: Diagnosis not present

## 2016-06-14 DIAGNOSIS — Z1389 Encounter for screening for other disorder: Secondary | ICD-10-CM | POA: Diagnosis not present

## 2016-06-14 DIAGNOSIS — Z6838 Body mass index (BMI) 38.0-38.9, adult: Secondary | ICD-10-CM | POA: Diagnosis not present

## 2016-06-14 DIAGNOSIS — E119 Type 2 diabetes mellitus without complications: Secondary | ICD-10-CM | POA: Diagnosis not present

## 2016-06-14 DIAGNOSIS — I10 Essential (primary) hypertension: Secondary | ICD-10-CM | POA: Diagnosis not present

## 2016-06-14 DIAGNOSIS — N182 Chronic kidney disease, stage 2 (mild): Secondary | ICD-10-CM | POA: Diagnosis not present

## 2016-06-14 DIAGNOSIS — E782 Mixed hyperlipidemia: Secondary | ICD-10-CM | POA: Diagnosis not present

## 2016-06-14 DIAGNOSIS — E1129 Type 2 diabetes mellitus with other diabetic kidney complication: Secondary | ICD-10-CM | POA: Diagnosis not present

## 2016-06-14 DIAGNOSIS — Z0000 Encounter for general adult medical examination without abnormal findings: Secondary | ICD-10-CM | POA: Diagnosis not present

## 2016-07-06 ENCOUNTER — Other Ambulatory Visit (HOSPITAL_COMMUNITY): Payer: Self-pay | Admitting: Family Medicine

## 2016-07-06 DIAGNOSIS — Z1231 Encounter for screening mammogram for malignant neoplasm of breast: Secondary | ICD-10-CM

## 2016-07-19 DIAGNOSIS — L0202 Furuncle of face: Secondary | ICD-10-CM | POA: Diagnosis not present

## 2016-07-19 DIAGNOSIS — L820 Inflamed seborrheic keratosis: Secondary | ICD-10-CM | POA: Diagnosis not present

## 2016-07-20 ENCOUNTER — Ambulatory Visit (HOSPITAL_COMMUNITY)
Admission: RE | Admit: 2016-07-20 | Discharge: 2016-07-20 | Disposition: A | Discharge status: Home / Self Care | Payer: Medicare Other | Source: Ambulatory Visit | Attending: Family Medicine | Admitting: Family Medicine

## 2016-07-20 ENCOUNTER — Encounter (HOSPITAL_COMMUNITY): Payer: Self-pay

## 2016-07-20 DIAGNOSIS — Z1231 Encounter for screening mammogram for malignant neoplasm of breast: Secondary | ICD-10-CM | POA: Diagnosis not present

## 2016-08-16 DIAGNOSIS — H2513 Age-related nuclear cataract, bilateral: Secondary | ICD-10-CM | POA: Diagnosis not present

## 2016-08-16 DIAGNOSIS — H35462 Secondary vitreoretinal degeneration, left eye: Secondary | ICD-10-CM | POA: Diagnosis not present

## 2016-08-16 DIAGNOSIS — E119 Type 2 diabetes mellitus without complications: Secondary | ICD-10-CM | POA: Diagnosis not present

## 2016-09-25 DIAGNOSIS — E875 Hyperkalemia: Secondary | ICD-10-CM | POA: Diagnosis not present

## 2016-10-26 DIAGNOSIS — E782 Mixed hyperlipidemia: Secondary | ICD-10-CM | POA: Diagnosis not present

## 2016-10-26 DIAGNOSIS — E119 Type 2 diabetes mellitus without complications: Secondary | ICD-10-CM | POA: Diagnosis not present

## 2016-10-26 DIAGNOSIS — I10 Essential (primary) hypertension: Secondary | ICD-10-CM | POA: Diagnosis not present

## 2016-10-26 DIAGNOSIS — Z6838 Body mass index (BMI) 38.0-38.9, adult: Secondary | ICD-10-CM | POA: Diagnosis not present

## 2016-12-09 DIAGNOSIS — Z23 Encounter for immunization: Secondary | ICD-10-CM | POA: Diagnosis not present

## 2017-05-01 DIAGNOSIS — E782 Mixed hyperlipidemia: Secondary | ICD-10-CM | POA: Diagnosis not present

## 2017-05-01 DIAGNOSIS — I10 Essential (primary) hypertension: Secondary | ICD-10-CM | POA: Diagnosis not present

## 2017-05-01 DIAGNOSIS — Z1389 Encounter for screening for other disorder: Secondary | ICD-10-CM | POA: Diagnosis not present

## 2017-05-01 DIAGNOSIS — E119 Type 2 diabetes mellitus without complications: Secondary | ICD-10-CM | POA: Diagnosis not present

## 2017-05-01 DIAGNOSIS — Z6838 Body mass index (BMI) 38.0-38.9, adult: Secondary | ICD-10-CM | POA: Diagnosis not present

## 2017-08-20 DIAGNOSIS — Z1389 Encounter for screening for other disorder: Secondary | ICD-10-CM | POA: Diagnosis not present

## 2017-08-20 DIAGNOSIS — Z6838 Body mass index (BMI) 38.0-38.9, adult: Secondary | ICD-10-CM | POA: Diagnosis not present

## 2017-08-20 DIAGNOSIS — E1129 Type 2 diabetes mellitus with other diabetic kidney complication: Secondary | ICD-10-CM | POA: Diagnosis not present

## 2017-08-20 DIAGNOSIS — Z23 Encounter for immunization: Secondary | ICD-10-CM | POA: Diagnosis not present

## 2017-08-20 DIAGNOSIS — Z0001 Encounter for general adult medical examination with abnormal findings: Secondary | ICD-10-CM | POA: Diagnosis not present

## 2017-08-21 ENCOUNTER — Other Ambulatory Visit (HOSPITAL_COMMUNITY): Payer: Self-pay | Admitting: Internal Medicine

## 2017-08-21 DIAGNOSIS — Z1231 Encounter for screening mammogram for malignant neoplasm of breast: Secondary | ICD-10-CM

## 2017-11-02 DIAGNOSIS — H2512 Age-related nuclear cataract, left eye: Secondary | ICD-10-CM | POA: Diagnosis not present

## 2017-11-02 DIAGNOSIS — H35462 Secondary vitreoretinal degeneration, left eye: Secondary | ICD-10-CM | POA: Diagnosis not present

## 2017-11-02 DIAGNOSIS — H25811 Combined forms of age-related cataract, right eye: Secondary | ICD-10-CM | POA: Diagnosis not present

## 2017-11-02 DIAGNOSIS — E119 Type 2 diabetes mellitus without complications: Secondary | ICD-10-CM | POA: Diagnosis not present

## 2017-12-13 DIAGNOSIS — Z23 Encounter for immunization: Secondary | ICD-10-CM | POA: Diagnosis not present

## 2017-12-28 DIAGNOSIS — Z6838 Body mass index (BMI) 38.0-38.9, adult: Secondary | ICD-10-CM | POA: Diagnosis not present

## 2017-12-28 DIAGNOSIS — E119 Type 2 diabetes mellitus without complications: Secondary | ICD-10-CM | POA: Diagnosis not present

## 2017-12-28 DIAGNOSIS — I10 Essential (primary) hypertension: Secondary | ICD-10-CM | POA: Diagnosis not present

## 2017-12-28 DIAGNOSIS — K219 Gastro-esophageal reflux disease without esophagitis: Secondary | ICD-10-CM | POA: Diagnosis not present

## 2017-12-28 DIAGNOSIS — E782 Mixed hyperlipidemia: Secondary | ICD-10-CM | POA: Diagnosis not present

## 2017-12-28 DIAGNOSIS — M8580 Other specified disorders of bone density and structure, unspecified site: Secondary | ICD-10-CM | POA: Diagnosis not present

## 2017-12-28 DIAGNOSIS — Z1389 Encounter for screening for other disorder: Secondary | ICD-10-CM | POA: Diagnosis not present

## 2018-01-15 DIAGNOSIS — L280 Lichen simplex chronicus: Secondary | ICD-10-CM | POA: Diagnosis not present

## 2018-01-15 DIAGNOSIS — D225 Melanocytic nevi of trunk: Secondary | ICD-10-CM | POA: Diagnosis not present

## 2018-01-15 DIAGNOSIS — X32XXXD Exposure to sunlight, subsequent encounter: Secondary | ICD-10-CM | POA: Diagnosis not present

## 2018-01-15 DIAGNOSIS — L570 Actinic keratosis: Secondary | ICD-10-CM | POA: Diagnosis not present

## 2018-01-25 DIAGNOSIS — H35372 Puckering of macula, left eye: Secondary | ICD-10-CM | POA: Diagnosis not present

## 2018-01-25 DIAGNOSIS — H43813 Vitreous degeneration, bilateral: Secondary | ICD-10-CM | POA: Diagnosis not present

## 2018-03-08 DIAGNOSIS — E113293 Type 2 diabetes mellitus with mild nonproliferative diabetic retinopathy without macular edema, bilateral: Secondary | ICD-10-CM | POA: Diagnosis not present

## 2018-03-08 DIAGNOSIS — H43813 Vitreous degeneration, bilateral: Secondary | ICD-10-CM | POA: Diagnosis not present

## 2018-03-08 DIAGNOSIS — H2513 Age-related nuclear cataract, bilateral: Secondary | ICD-10-CM | POA: Diagnosis not present

## 2018-03-08 DIAGNOSIS — H35372 Puckering of macula, left eye: Secondary | ICD-10-CM | POA: Diagnosis not present

## 2018-03-21 DIAGNOSIS — H25811 Combined forms of age-related cataract, right eye: Secondary | ICD-10-CM | POA: Diagnosis not present

## 2018-03-21 DIAGNOSIS — H2511 Age-related nuclear cataract, right eye: Secondary | ICD-10-CM | POA: Diagnosis not present

## 2018-03-21 DIAGNOSIS — E119 Type 2 diabetes mellitus without complications: Secondary | ICD-10-CM | POA: Diagnosis not present

## 2018-03-21 DIAGNOSIS — Z01818 Encounter for other preprocedural examination: Secondary | ICD-10-CM | POA: Diagnosis not present

## 2018-03-21 DIAGNOSIS — H2512 Age-related nuclear cataract, left eye: Secondary | ICD-10-CM | POA: Diagnosis not present

## 2018-04-03 DIAGNOSIS — E782 Mixed hyperlipidemia: Secondary | ICD-10-CM | POA: Diagnosis not present

## 2018-04-03 DIAGNOSIS — Z1389 Encounter for screening for other disorder: Secondary | ICD-10-CM | POA: Diagnosis not present

## 2018-04-03 DIAGNOSIS — I10 Essential (primary) hypertension: Secondary | ICD-10-CM | POA: Diagnosis not present

## 2018-04-03 DIAGNOSIS — N182 Chronic kidney disease, stage 2 (mild): Secondary | ICD-10-CM | POA: Diagnosis not present

## 2018-04-25 DIAGNOSIS — E119 Type 2 diabetes mellitus without complications: Secondary | ICD-10-CM | POA: Diagnosis not present

## 2018-04-25 DIAGNOSIS — H2512 Age-related nuclear cataract, left eye: Secondary | ICD-10-CM | POA: Diagnosis not present

## 2018-04-25 DIAGNOSIS — H25812 Combined forms of age-related cataract, left eye: Secondary | ICD-10-CM | POA: Diagnosis not present

## 2018-04-25 DIAGNOSIS — Z7901 Long term (current) use of anticoagulants: Secondary | ICD-10-CM | POA: Diagnosis not present

## 2018-05-16 IMAGING — MG 2D DIGITAL SCREENING BILATERAL MAMMOGRAM WITH CAD AND ADJUNCT TO
6 of 9 series · 6 of 25 positions shown · non-contrast
Comparison: Previous exam(s).

CLINICAL DATA: Screening.

EXAM:
2D DIGITAL SCREENING BILATERAL MAMMOGRAM WITH CAD AND ADJUNCT TOMO

[L MLO (1 of 2)]
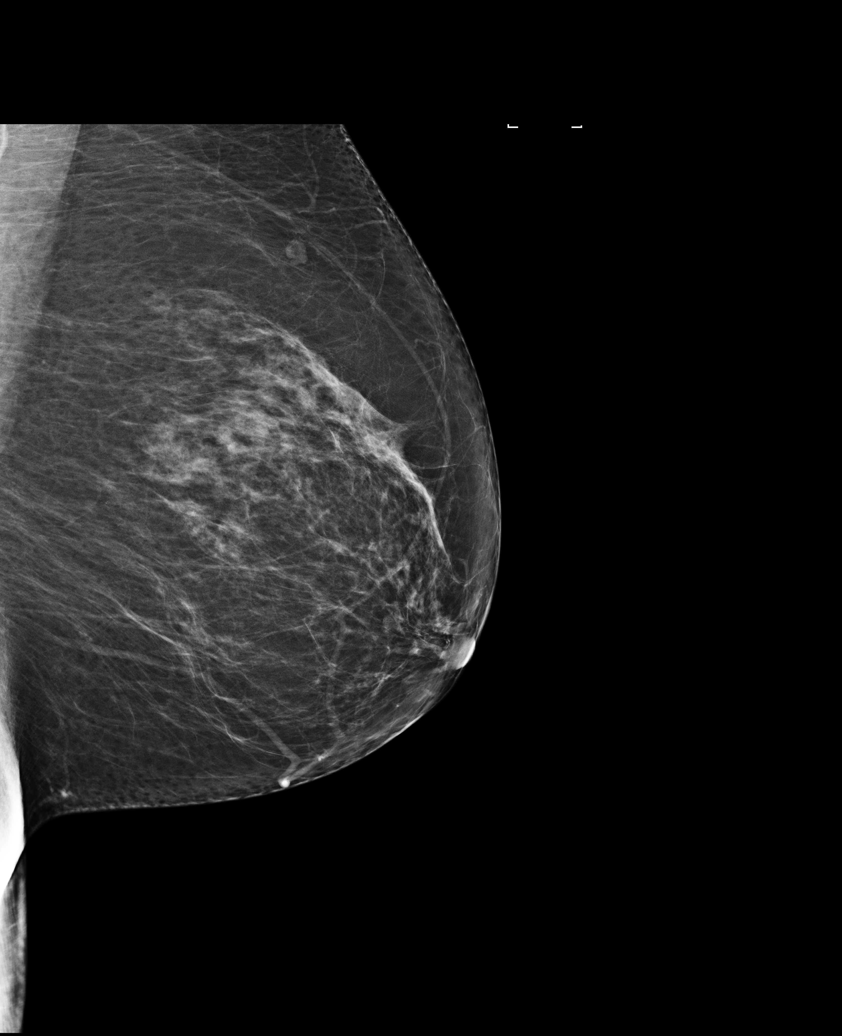

[R MLO]
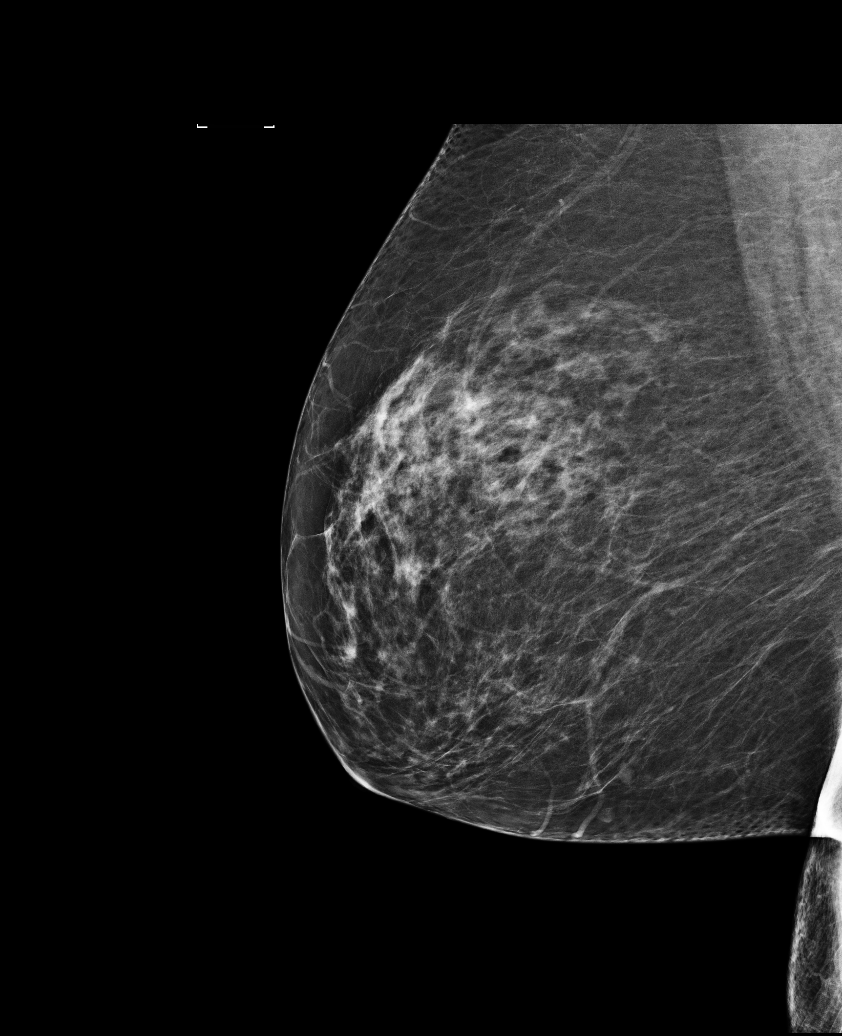

[L MLO (2 of 2)]
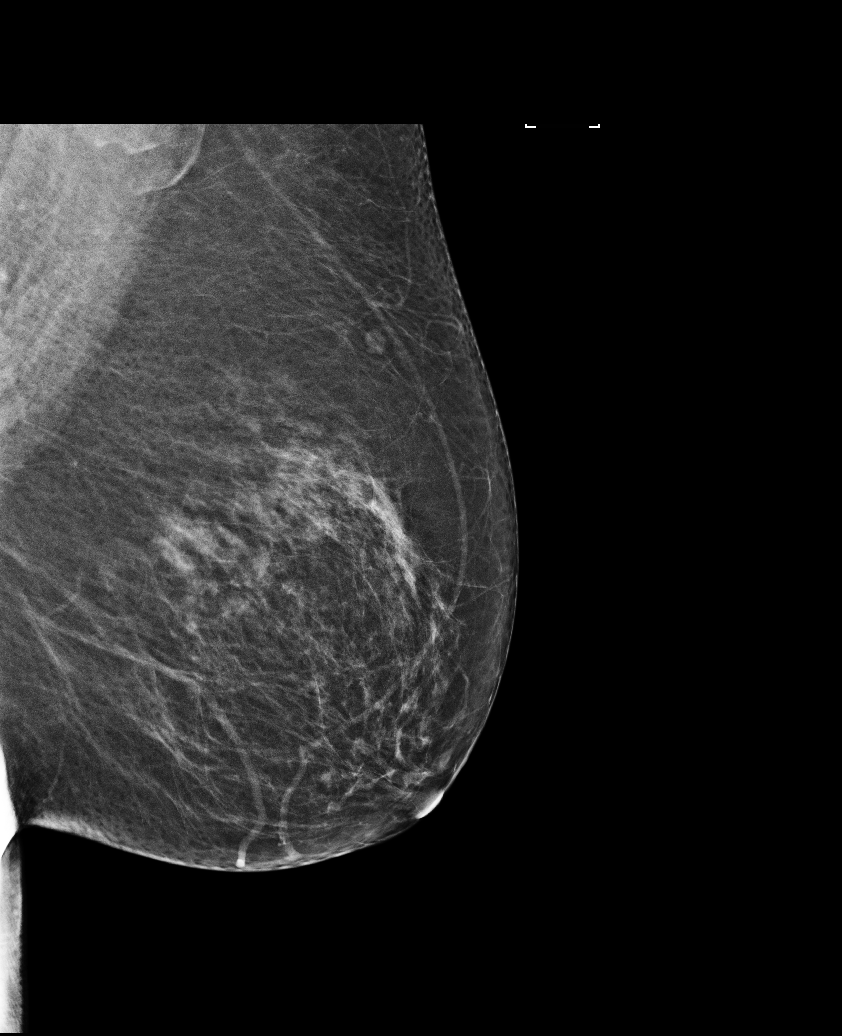

[R CC]
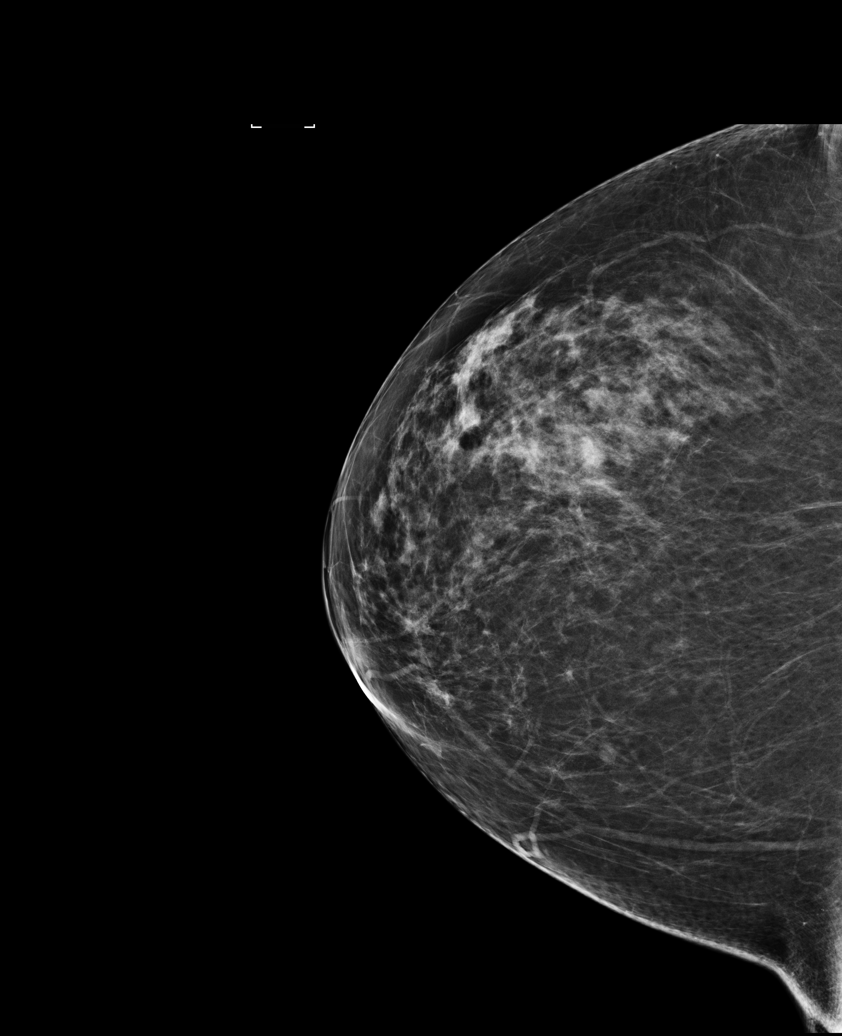

[L CC]
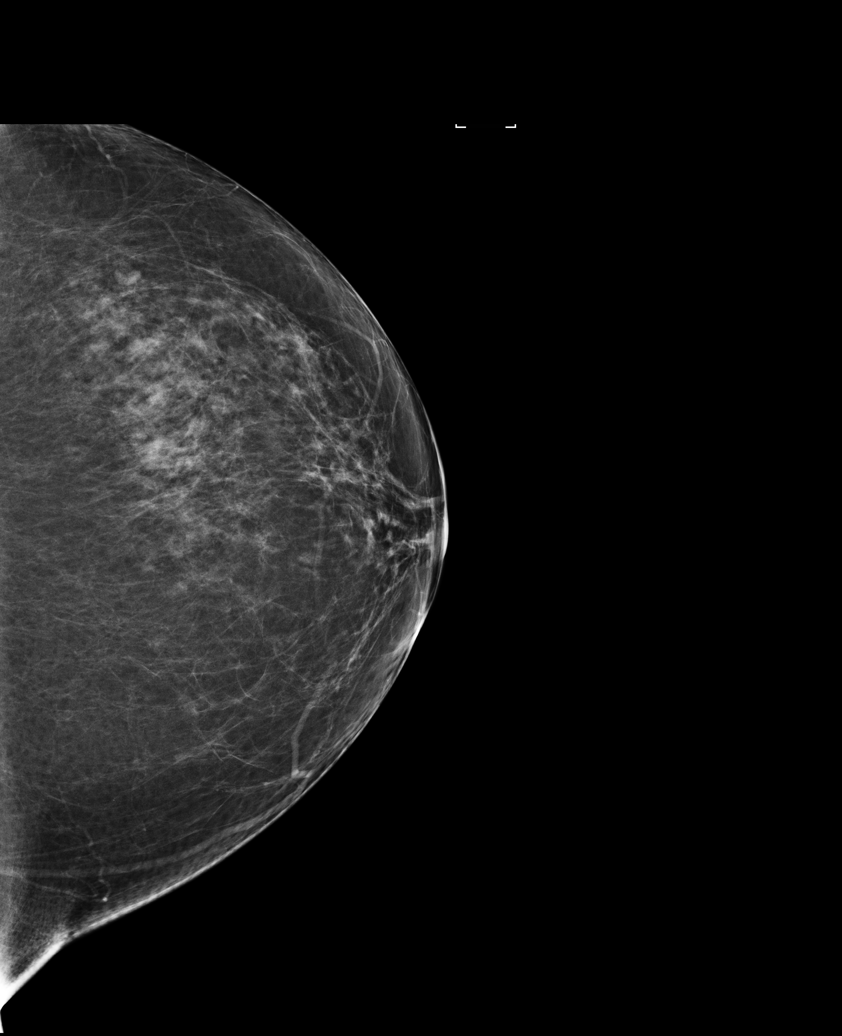

[R CC tomo · tomo slice 38/75.0]
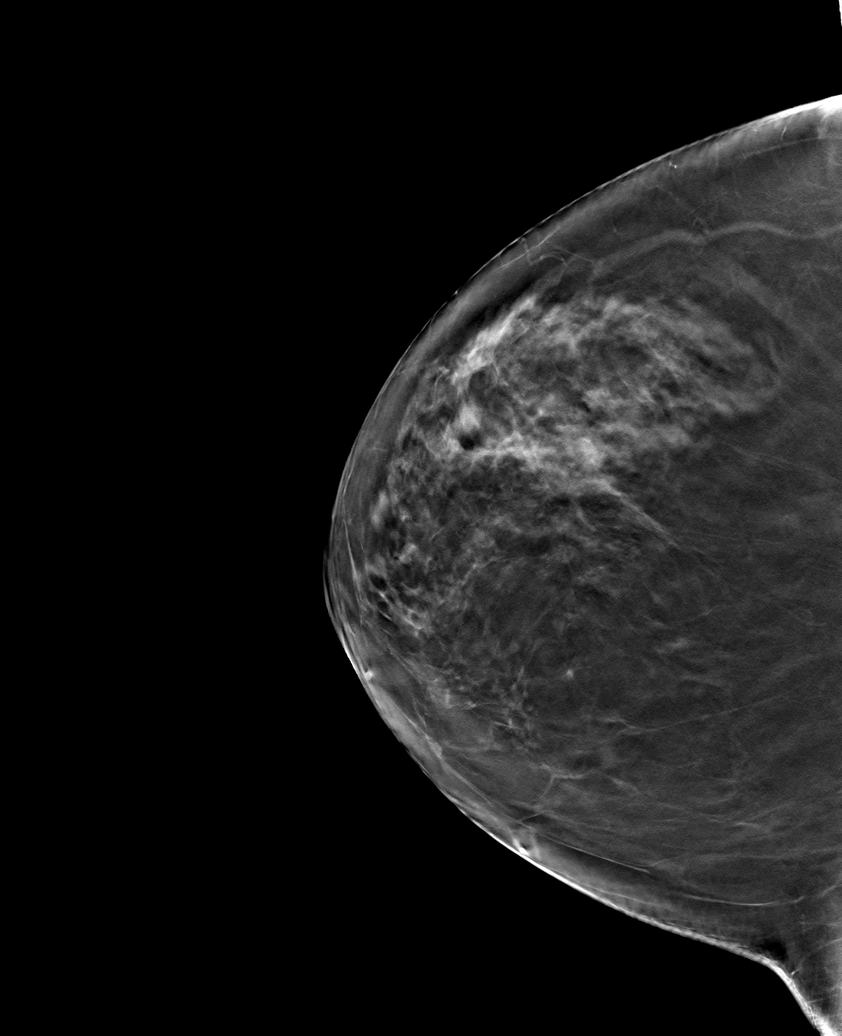

[6 of 25 positions shown; findings below may reference images not displayed]

ACR Breast Density Category b: There are scattered areas of
fibroglandular density.
FINDINGS: There are no findings suspicious for malignancy. Images were
processed with CAD.
IMPRESSION: No mammographic evidence of malignancy. A result letter of this
screening mammogram will be mailed directly to the patient.

RECOMMENDATION:
Screening mammogram in one year. (Code:97-6-RS4)

BI-RADS CATEGORY  1: Negative.

## 2018-05-20 DIAGNOSIS — J22 Unspecified acute lower respiratory infection: Secondary | ICD-10-CM | POA: Diagnosis not present

## 2018-05-20 DIAGNOSIS — Z6838 Body mass index (BMI) 38.0-38.9, adult: Secondary | ICD-10-CM | POA: Diagnosis not present

## 2018-05-20 DIAGNOSIS — Z1389 Encounter for screening for other disorder: Secondary | ICD-10-CM | POA: Diagnosis not present

## 2018-09-18 DIAGNOSIS — Z1389 Encounter for screening for other disorder: Secondary | ICD-10-CM | POA: Diagnosis not present

## 2018-09-18 DIAGNOSIS — Z0001 Encounter for general adult medical examination with abnormal findings: Secondary | ICD-10-CM | POA: Diagnosis not present

## 2018-09-18 DIAGNOSIS — E1129 Type 2 diabetes mellitus with other diabetic kidney complication: Secondary | ICD-10-CM | POA: Diagnosis not present

## 2018-09-18 DIAGNOSIS — Z6837 Body mass index (BMI) 37.0-37.9, adult: Secondary | ICD-10-CM | POA: Diagnosis not present

## 2018-12-04 DIAGNOSIS — Z23 Encounter for immunization: Secondary | ICD-10-CM | POA: Diagnosis not present

## 2018-12-17 DIAGNOSIS — Z23 Encounter for immunization: Secondary | ICD-10-CM | POA: Diagnosis not present

## 2019-01-21 ENCOUNTER — Encounter: Payer: Self-pay | Admitting: Internal Medicine

## 2019-01-28 ENCOUNTER — Other Ambulatory Visit (HOSPITAL_COMMUNITY): Payer: Self-pay | Admitting: Physician Assistant

## 2019-01-28 DIAGNOSIS — Z1231 Encounter for screening mammogram for malignant neoplasm of breast: Secondary | ICD-10-CM

## 2019-02-19 ENCOUNTER — Ambulatory Visit (HOSPITAL_COMMUNITY): Payer: No Typology Code available for payment source

## 2019-03-05 ENCOUNTER — Other Ambulatory Visit: Payer: Self-pay

## 2019-03-05 ENCOUNTER — Ambulatory Visit (HOSPITAL_COMMUNITY)
Admission: RE | Admit: 2019-03-05 | Discharge: 2019-03-05 | Disposition: A | Discharge status: Home / Self Care | Payer: Medicare Other | Source: Ambulatory Visit | Attending: Physician Assistant | Admitting: Physician Assistant

## 2019-03-05 DIAGNOSIS — Z1231 Encounter for screening mammogram for malignant neoplasm of breast: Secondary | ICD-10-CM | POA: Insufficient documentation

## 2019-04-06 DIAGNOSIS — N182 Chronic kidney disease, stage 2 (mild): Secondary | ICD-10-CM | POA: Diagnosis not present

## 2019-04-06 DIAGNOSIS — E1122 Type 2 diabetes mellitus with diabetic chronic kidney disease: Secondary | ICD-10-CM | POA: Diagnosis not present

## 2019-04-06 DIAGNOSIS — E7849 Other hyperlipidemia: Secondary | ICD-10-CM | POA: Diagnosis not present

## 2019-04-06 DIAGNOSIS — I129 Hypertensive chronic kidney disease with stage 1 through stage 4 chronic kidney disease, or unspecified chronic kidney disease: Secondary | ICD-10-CM | POA: Diagnosis not present

## 2019-04-11 DIAGNOSIS — Z23 Encounter for immunization: Secondary | ICD-10-CM | POA: Diagnosis not present

## 2019-04-23 DIAGNOSIS — E7849 Other hyperlipidemia: Secondary | ICD-10-CM | POA: Diagnosis not present

## 2019-04-23 DIAGNOSIS — Z1389 Encounter for screening for other disorder: Secondary | ICD-10-CM | POA: Diagnosis not present

## 2019-04-23 DIAGNOSIS — E782 Mixed hyperlipidemia: Secondary | ICD-10-CM | POA: Diagnosis not present

## 2019-04-23 DIAGNOSIS — E1129 Type 2 diabetes mellitus with other diabetic kidney complication: Secondary | ICD-10-CM | POA: Diagnosis not present

## 2019-04-23 DIAGNOSIS — E119 Type 2 diabetes mellitus without complications: Secondary | ICD-10-CM | POA: Diagnosis not present

## 2019-04-23 DIAGNOSIS — Z6837 Body mass index (BMI) 37.0-37.9, adult: Secondary | ICD-10-CM | POA: Diagnosis not present

## 2019-05-04 DIAGNOSIS — N182 Chronic kidney disease, stage 2 (mild): Secondary | ICD-10-CM | POA: Diagnosis not present

## 2019-05-04 DIAGNOSIS — E7849 Other hyperlipidemia: Secondary | ICD-10-CM | POA: Diagnosis not present

## 2019-05-04 DIAGNOSIS — I129 Hypertensive chronic kidney disease with stage 1 through stage 4 chronic kidney disease, or unspecified chronic kidney disease: Secondary | ICD-10-CM | POA: Diagnosis not present

## 2019-05-04 DIAGNOSIS — E1122 Type 2 diabetes mellitus with diabetic chronic kidney disease: Secondary | ICD-10-CM | POA: Diagnosis not present

## 2019-05-10 DIAGNOSIS — Z23 Encounter for immunization: Secondary | ICD-10-CM | POA: Diagnosis not present

## 2019-08-13 DIAGNOSIS — E119 Type 2 diabetes mellitus without complications: Secondary | ICD-10-CM | POA: Diagnosis not present

## 2019-09-23 NOTE — Progress Notes (Signed)
Primary Care Physician:  Jake Samples, PA-C Primary Gastroenterologist:  Dr. Gala Romney  Chief Complaint  Patient presents with  . change in bowels    constipation to loose stools, BM's are painful, bright red blood last week    HPI:   Maria Morrison is a 74 y.o. female presenting today due to alternating constipation and diarrhea and pain with bowel movements.  She was last seen at the time of colonoscopy 02/25/2014 which revealed two 5 mm polyps at the splenic flexure, anal papilla, and internal hemorrhoids.  Pathology revealed tubular adenomas.  Recommended repeat colonoscopy in 5 years.  Today: Pain with BMs started a couple of weeks ago. Last week was the worst. Feels like glass when she is passing a BM. Associated rectal bleeding. Bright red blood on toilet tissue and in stool.  Low volume.  Not daily but several times a week. Had rectal bleeding prior to this years ago with similar symptoms. She was given a prescription for some sort of suppository which helped. Has been using the preparation H suppositories. Feels this has helped somewhat but she continues with rectal pain. Some burning and itching as well.   Prior to rectal pain, she was struggling with constipation. Tries to have a BM every day but doesn't always. Stools had been hard. Was having to strain. No abdominal pain. Uses MiraLAX as needed. Used it a couple times last week along with stool softeners. Stools are looser this week. Had 4 BMs yesterday. 2 today. Mushy, not watery. No dietary changes. No changes in stress. No new medications.  No pre-syncope or syncope. Mild dizziness with position changes which is chronic.  No fever or chills.  GERD well controlled on Prilosec OTC.  No dysphagia.  Past Medical History:  Diagnosis Date  . Diabetes mellitus without complication ([place])   . GERD (gastroesophageal reflux disease)   . HLD (hyperlipidemia)   . HTN (hypertension)     Past Surgical History:  Procedure  Laterality Date  . APPENDECTOMY    . BACK SURGERY    . COLONOSCOPY N/A 02/25/2014   Procedure: COLONOSCOPY;  Surgeon: Daneil Dolin, MD;  two tubular adenomas, anal papilla, internal hemorrhoids.  Recommended repeat colonoscopy in 5 years.  . TUBAL LIGATION      Current Outpatient Medications  Medication Sig Dispense Refill  . acetaminophen (TYLENOL) 500 MG tablet Take 1,000 mg by mouth every 6 (six) hours as needed for moderate pain (for back and knee pain).    Marland Kitchen aspirin EC 81 MG tablet Take 81 mg by mouth daily. Swallow whole.    Marland Kitchen atorvastatin (LIPITOR) 10 MG tablet Take 10 mg by mouth daily.    Marland Kitchen lisinopril (PRINIVIL,ZESTRIL) 10 MG tablet Take 10 mg by mouth daily.    . Melatonin 3 MG CAPS Take 3 mg by mouth at bedtime.    . metFORMIN (GLUCOPHAGE) 500 MG tablet Take 500 mg by mouth daily with breakfast.    . omeprazole (PRILOSEC OTC) 20 MG tablet Take 20 mg by mouth daily.    . polyethylene glycol (MIRALAX / GLYCOLAX) packet Take 17 g by mouth as needed.      No current facility-administered medications for this visit.    Allergies as of 09/24/2019 - Review Complete 09/24/2019  Allergen Reaction Noted  . Codeine Nausea Only 02/11/2014    Family History  Problem Relation Age of Onset  . Stroke Mother   . Parkinson's disease Father   . Stroke Maternal Grandfather   .  Stroke Paternal Grandfather   . Stroke Paternal Grandmother   . Colon cancer Neg Hx     Social History   Socioeconomic History  . Marital status: Married    Spouse name: [name]  . Number of children: 0  . Years of education: 36  . Highest education level: Not on file  Occupational History    Comment: retired  Tobacco Use  . Smoking status: Former Research scientist (life sciences)  . Smokeless tobacco: Never Used  Substance and Sexual Activity  . Alcohol use: No    Alcohol/week: 0.0 standard drinks  . Drug use: No  . Sexual activity: Not on file  Other Topics Concern  . Not on file  Social History Narrative   Consumes  3-4 cans of soda daily   Social Determinants of Health   Financial Resource Strain:   . Difficulty of Paying Living Expenses:   Food Insecurity:   . Worried About Charity fundraiser in the Last Year:   . Arboriculturist in the Last Year:   Transportation Needs:   . Film/video editor (Medical):   Marland Kitchen Lack of Transportation (Non-Medical):   Physical Activity:   . Days of Exercise per Week:   . Minutes of Exercise per Session:   Stress:   . Feeling of Stress :   Social Connections:   . Frequency of Communication with Friends and Family:   . Frequency of Social Gatherings with Friends and Family:   . Attends Religious Services:   . Active Member of Clubs or Organizations:   . Attends Archivist Meetings:   Marland Kitchen Marital Status:   Intimate Partner Violence:   . Fear of Current or Ex-Partner:   . Emotionally Abused:   Marland Kitchen Physically Abused:   . Sexually Abused:     Review of Systems: Gen: See HPI CV: Denies chest pain or heart palpitations. Resp: Denies shortness of breath or cough. GI: See HPI Heme: See HPI  Physical Exam: BP (!) 142/67   Pulse 81   Temp (!) 97.1 F (36.2 C)   Ht 5\' 3"  (1.6 m)   Wt 207 lb 12.8 oz (94.3 kg)   BMI 36.81 kg/m  General:   Alert and oriented. Pleasant and cooperative. Well-nourished and well-developed.  Head:  Normocephalic and atraumatic. Eyes:  Without icterus, sclera clear and conjunctiva pink.  Ears:  Normal auditory acuity. Lungs:  Clear to auscultation bilaterally. No wheezes, rales, or rhonchi. No distress.  Heart:  S1, S2 present without murmurs appreciated.  Abdomen:  +BS, soft, non-tender and non-distended. No HSM noted. No guarding or rebound. No masses appreciated.  Rectal: External exam within normal limits.  No external hemorrhoids, skin tags, or obvious fissure.  Internal exam limited.  Patient had sharp rectal pain when attempting internal exam, so exam was terminated.  No bright red blood. Msk:  Symmetrical  without gross deformities. Normal posture. Extremities:  Without edema. Neurologic:  Alert and  oriented x4;  grossly normal neurologically. Skin:  Intact without significant lesions or rashes. Psych:  Normal mood and affect.

## 2019-09-24 ENCOUNTER — Other Ambulatory Visit: Payer: Self-pay

## 2019-09-24 ENCOUNTER — Ambulatory Visit (INDEPENDENT_AMBULATORY_CARE_PROVIDER_SITE_OTHER): Payer: Medicare Other | Admitting: Gastroenterology

## 2019-09-24 ENCOUNTER — Encounter: Payer: Self-pay | Admitting: Gastroenterology

## 2019-09-24 VITALS — BP 142/67 | HR 81 | Temp 97.1°F | Ht 63.0 in | Wt 207.8 lb

## 2019-09-24 DIAGNOSIS — K625 Hemorrhage of anus and rectum: Secondary | ICD-10-CM

## 2019-09-24 DIAGNOSIS — K6289 Other specified diseases of anus and rectum: Secondary | ICD-10-CM | POA: Diagnosis not present

## 2019-09-24 DIAGNOSIS — Z86010 Personal history of colonic polyps: Secondary | ICD-10-CM | POA: Diagnosis not present

## 2019-09-24 DIAGNOSIS — R198 Other specified symptoms and signs involving the digestive system and abdomen: Secondary | ICD-10-CM

## 2019-09-24 DIAGNOSIS — K5900 Constipation, unspecified: Secondary | ICD-10-CM | POA: Insufficient documentation

## 2019-09-24 NOTE — Assessment & Plan Note (Signed)
74 year old female with history of tubular adenomas.  Last colonoscopy in December 2015 currently overdue for surveillance colonoscopy.  At this time, she is dealing with new onset of sharp rectal pain with bowel movements with associated low-volume rectal bleeding.  This is discussed below.  Overall, suspect anal fissure with possible component of hemorrhoid flare as well.  We will plan to follow-up with her in 8 weeks.  We will circle back to colonoscopy once rectal pain has resolved.

## 2019-09-24 NOTE — Assessment & Plan Note (Signed)
History of mild constipation.  After addition of MiraLAX and stool softeners last week, patient has had 2 days of mushy stools.  Suspect this is secondary to stool softener/laxative.  I have advised she add Benefiber or Metamucil daily to help with stool consistency and underlying constipation.  She can add MiraLAX 1 capful (17 g) daily in 8 ounces of water if constipation returns.  She will let me know if loose stools or diarrhea are persistent.

## 2019-09-24 NOTE — Assessment & Plan Note (Signed)
74 year old female with new onset sharp rectal pain with bowel movements x2 weeks.  Also with rectal burning and itching.  Associated bright red blood per rectum with bowel movements.  Low-volume with blood on toilet tissue and in stool at times.  Prior to onset, she had struggling with constipation hard stools.  Constipation has resolved with the help of MiraLAX and stool softeners and stools are now mushy.  Last colonoscopy in December 2015 with 2 tubular adenomas and internal hemorrhoids.  She was due for repeat in December 2020.  Attempted rectal exam today.  External exam with no obvious anal fissure.  Attempted internal exam but this was limited due to sharp pain.  Overall, suspect she likely has an anal fissure secondary to constipation.  May also have symptoms secondary to hemorrhoids.   Plan: Start [facility] hemorrhoid cream compounded with 0.125% nitroglycerin 4 times daily per rectum.  Plan to treat for 4-6 weeks but may extend treatment.  Goal is to treat for at least 2 weeks after symptom resolution. Advised patient to lay down when applying the cream due to possible dizziness. Use Tucks pads when wiping to help with rectal irritation. Add Benefiber or Metamucil daily.  Use MiraLAX 1 capful (17 g) daily in 8 ounces of water if constipation returns. Avoid straining. Limit toilet time to 2-3 minutes. She will need colonoscopy in the near future once rectal pain resolves. Plan to follow-up in the office in 8 weeks.  Requested progress report in 2 weeks.

## 2019-09-24 NOTE — Patient Instructions (Signed)
I have called in a hemorrhoid cream compounded with 0.125% nitroglycerin to [facility].  They will call you when it is ready to pick up. You will apply this cream 4 times a day per your rectum.  We will plan to treat for 4-6 weeks but ultimately may treat longer.  The goal is to treat for 2 weeks after symptom resolution.  Be sure to lay down when applying the cream as it may cause dizziness.  Stop using Preparation H suppositories for now.  You may try using Tucks pads to help with rectal discomfort when wiping.  I recommend you add Benefiber or Metamucil daily.  Should you have any return of hard stools, add MiraLAX 1 capful (17 g) daily in 8 ounces of water.  Be sure to avoid straining.  Limit toilet time to 2-3 minutes.  Monitor for any persistent diarrhea and let me know.  Please call me in 2 weeks with a progress report.   We will plan to see back in the office in 8 weeks.  Aliene Altes, PA-C [facility]

## 2019-09-24 NOTE — Assessment & Plan Note (Signed)
Addressed under rectal pain.  °

## 2019-10-01 DIAGNOSIS — Z1389 Encounter for screening for other disorder: Secondary | ICD-10-CM | POA: Diagnosis not present

## 2019-10-01 DIAGNOSIS — Z6838 Body mass index (BMI) 38.0-38.9, adult: Secondary | ICD-10-CM | POA: Diagnosis not present

## 2019-10-01 DIAGNOSIS — E1129 Type 2 diabetes mellitus with other diabetic kidney complication: Secondary | ICD-10-CM | POA: Diagnosis not present

## 2019-10-01 DIAGNOSIS — Z0001 Encounter for general adult medical examination with abnormal findings: Secondary | ICD-10-CM | POA: Diagnosis not present

## 2019-10-03 DIAGNOSIS — E1122 Type 2 diabetes mellitus with diabetic chronic kidney disease: Secondary | ICD-10-CM | POA: Diagnosis not present

## 2019-10-03 DIAGNOSIS — N182 Chronic kidney disease, stage 2 (mild): Secondary | ICD-10-CM | POA: Diagnosis not present

## 2019-10-03 DIAGNOSIS — E7849 Other hyperlipidemia: Secondary | ICD-10-CM | POA: Diagnosis not present

## 2019-10-03 DIAGNOSIS — I129 Hypertensive chronic kidney disease with stage 1 through stage 4 chronic kidney disease, or unspecified chronic kidney disease: Secondary | ICD-10-CM | POA: Diagnosis not present

## 2019-10-09 ENCOUNTER — Telehealth: Payer: Self-pay | Admitting: Internal Medicine

## 2019-10-09 NOTE — Telephone Encounter (Signed)
Noted  

## 2019-10-09 NOTE — Telephone Encounter (Signed)
Pt was calling to give a phone report for Aliene Altes, [place]. I told her the nurse has just gotten in and I would have her call patient back. Pt said that she would call back instead. [phone]

## 2019-10-31 ENCOUNTER — Ambulatory Visit: Payer: No Typology Code available for payment source | Admitting: Internal Medicine

## 2019-11-19 DIAGNOSIS — Z23 Encounter for immunization: Secondary | ICD-10-CM | POA: Diagnosis not present

## 2019-11-25 NOTE — Progress Notes (Signed)
Referring Provider: Jake Samples, PA* Primary Care Physician:  Jake Samples, PA-C Primary GI Physician: Dr. Gala Romney  Chief Complaint  Patient presents with  . Rectal Pain    none recently. no RB    HPI:   Maria Morrison is a 74 y.o. female presenting today for follow-up of alternating constipation and diarrhea, rectal pain, and rectal bleeding.  She was last seen in our office 09/24/2019 for the same.  Colonoscopy in December 2015 with two 5 mm tubular adenomas, anal papilla, and internal hemorrhoids with recommendations to repeat in 5 years, currently overdue.  At her last visit, she reported new onset of sharp rectal pain with BMs that had started a couple weeks prior with associated low-volume bright red blood on toilet tissue and in the stool several times a week.  Symptoms started after an episode of constipation where her stools were hard and required straining.  Preparation H suppositories were helping somewhat.  She had use MiraLAX a couple times the week prior with stool softeners for constipation.  Stools have become looser the week of her office visit.  External rectal exam was within normal limits.  Internal exam was limited as patient had sharp rectal pain when attempting DRE.  Suspected anal fissure and possible hemorrhoids.  Plan to start [facility] hemorrhoid cream compounded with 0.125% nitroglycerin 4 times daily x4-6 weeks with goal of treating at least 2 weeks after symptom resolution, add Benefiber or Metamucil daily.  Use MiraLAX if needed for constipation, circle back to colonoscopy in the near future once rectal pain resolved, follow-up in 8 weeks.  Requested progress report in 2 weeks.  Patient called to give progress report.  Nurse was not available when she called and patient stated she would call back.  Today: Rectal pain has resolved. No longer using the hemorrhoid cream. No rectal bleeding. Having BMs are daily. Has had to use MiraLAX 3  times in the last 2 months. She has limited her cheese and crackers which she thinks was contributing to constipation. No abdominal pain. No black stools. Weight is stable. No nausea or vomiting. Occasional GERD symptoms. Taking omeprazole 20 mg daily. Usually omeprazole keeps symptoms well controlled. Breakthrough is rare. No dysphagia.   Past Medical History:  Diagnosis Date  . Diabetes mellitus without complication ([place])   . GERD (gastroesophageal reflux disease)   . HLD (hyperlipidemia)   . HTN (hypertension)     Past Surgical History:  Procedure Laterality Date  . APPENDECTOMY    . BACK SURGERY    . COLONOSCOPY N/A 02/25/2014   Procedure: COLONOSCOPY;  Surgeon: Daneil Dolin, MD;  two tubular adenomas, anal papilla, internal hemorrhoids.  Recommended repeat colonoscopy in 5 years.  . TUBAL LIGATION      Current Outpatient Medications  Medication Sig Dispense Refill  . acetaminophen (TYLENOL) 500 MG tablet Take 1,000 mg by mouth every 6 (six) hours as needed for moderate pain (for back and knee pain).    Marland Kitchen aspirin EC 81 MG tablet Take 81 mg by mouth daily. Swallow whole.    Marland Kitchen atorvastatin (LIPITOR) 10 MG tablet Take 10 mg by mouth daily.    Marland Kitchen lisinopril (PRINIVIL,ZESTRIL) 10 MG tablet Take 10 mg by mouth daily.    . Melatonin 3 MG CAPS Take 3 mg by mouth at bedtime.    . metFORMIN (GLUCOPHAGE) 500 MG tablet Take 500 mg by mouth daily with breakfast.    . omeprazole (PRILOSEC OTC) 20 MG  tablet Take 20 mg by mouth daily.    . polyethylene glycol (MIRALAX / GLYCOLAX) packet Take 17 g by mouth as needed.      No current facility-administered medications for this visit.    Allergies as of 11/26/2019 - Review Complete 11/26/2019  Allergen Reaction Noted  . Codeine Nausea Only 02/11/2014    Family History  Problem Relation Age of Onset  . Stroke Mother   . Parkinson's disease Father   . Stroke Maternal Grandfather   . Stroke Paternal Grandfather   . Stroke Paternal  Grandmother   . Colon cancer Neg Hx     Social History   Socioeconomic History  . Marital status: Married    Spouse name: [name]  . Number of children: 0  . Years of education: 17  . Highest education level: Not on file  Occupational History    Comment: retired  Tobacco Use  . Smoking status: Former Research scientist (life sciences)  . Smokeless tobacco: Never Used  Substance and Sexual Activity  . Alcohol use: No    Alcohol/week: 0.0 standard drinks  . Drug use: No  . Sexual activity: Not on file  Other Topics Concern  . Not on file  Social History Narrative   Consumes 3-4 cans of soda daily   Social Determinants of Health   Financial Resource Strain:   . Difficulty of Paying Living Expenses: Not on file  Food Insecurity:   . Worried About Charity fundraiser in the Last Year: Not on file  . Ran Out of Food in the Last Year: Not on file  Transportation Needs:   . Lack of Transportation (Medical): Not on file  . Lack of Transportation (Non-Medical): Not on file  Physical Activity:   . Days of Exercise per Week: Not on file  . Minutes of Exercise per Session: Not on file  Stress:   . Feeling of Stress : Not on file  Social Connections:   . Frequency of Communication with Friends and Family: Not on file  . Frequency of Social Gatherings with Friends and Family: Not on file  . Attends Religious Services: Not on file  . Active Member of Clubs or Organizations: Not on file  . Attends Archivist Meetings: Not on file  . Marital Status: Not on file    Review of Systems: Gen: Denies fever, chills, cold or flulike symptoms, presyncope, syncope. CV: Denies chest pain or palpitations Resp: Denies dyspnea or cough GI: See HPI Heme: See HPI  Physical Exam: BP 131/73   Pulse 80   Temp (!) 97 F (36.1 C) (Oral)   Ht 5\' 2"  (1.575 m)   Wt 209 lb 12.8 oz (95.2 kg)   BMI 38.37 kg/m  General:   Alert and oriented. No distress noted. Pleasant and cooperative.  Head:  Normocephalic and  atraumatic. Eyes:  Conjuctiva clear without scleral icterus. Heart:  S1, S2 present without murmurs appreciated. Lungs:  Clear to auscultation bilaterally. No wheezes, rales, or rhonchi. No distress.  Abdomen:  +BS, soft, non-tender and non-distended. No rebound or guarding. No HSM or masses noted. Msk:  Symmetrical without gross deformities. Normal posture. Extremities:  Without edema. Neurologic:  Alert and  oriented x4 Psych: Normal mood and affect.

## 2019-11-26 ENCOUNTER — Other Ambulatory Visit: Payer: Self-pay

## 2019-11-26 ENCOUNTER — Telehealth: Payer: Self-pay | Admitting: *Deleted

## 2019-11-26 ENCOUNTER — Ambulatory Visit (INDEPENDENT_AMBULATORY_CARE_PROVIDER_SITE_OTHER): Payer: Medicare Other | Admitting: Gastroenterology

## 2019-11-26 ENCOUNTER — Encounter: Payer: Self-pay | Admitting: Gastroenterology

## 2019-11-26 VITALS — BP 131/73 | HR 80 | Temp 97.0°F | Ht 62.0 in | Wt 209.8 lb

## 2019-11-26 DIAGNOSIS — Z86010 Personal history of colonic polyps: Secondary | ICD-10-CM

## 2019-11-26 DIAGNOSIS — K625 Hemorrhage of anus and rectum: Secondary | ICD-10-CM | POA: Diagnosis not present

## 2019-11-26 DIAGNOSIS — K6289 Other specified diseases of anus and rectum: Secondary | ICD-10-CM | POA: Diagnosis not present

## 2019-11-26 NOTE — Assessment & Plan Note (Addendum)
74 year old female with history of tubular adenomas.  Last colonoscopy in December 2015, currently overdue for surveillance colonoscopy.  No significant lower GI symptoms.  She had experienced sharp rectal pain with BMs and low-volume rectal bleeding back in July.  Symptoms are now resolved s/p [facility] hemorrhoid cream compounded with 0.125% nitroglycerin.  Constipation is well controlled with MiraLAX as needed.   Suspect rectal bleeding/pain was secondary to anal fissure. Possible hemorrhoids as well.   Plan:  Proceed with colonoscopy with Dr. Gala Romney in the near future. The risks, benefits, and alternatives have been discussed with the patient in detail. The patient states understanding and desires to proceed.  ASA II Follow-up as needed.

## 2019-11-26 NOTE — Patient Instructions (Signed)
We will get you scheduled for a colonoscopy in the near future with Dr. Gala Romney.  1 day prior to colonoscopy: Take 1/2 dose of metformin (250mg ) Day of colonoscopy: Do not take morning diabetes medications.  We will follow-up with you as needed. Do not hesitate to call with questions or concerns.   Aliene Altes, PA-C [facility]

## 2019-11-26 NOTE — Progress Notes (Signed)
CC'ED TO PCP 

## 2019-11-26 NOTE — Assessment & Plan Note (Signed)
Likely secondary to anal fissure in the setting of constipation.  Now resolved s/p [facility] hemorrhoid cream compounded with 0.125% nitroglycerin. Constipation well controlled on MiraLAX as needed. She will continue to monitor for return of symptoms and let us know if this occurs.

## 2019-11-26 NOTE — Telephone Encounter (Signed)
Patient called back. She has been scheduled for procedure 11/24 at 8:30am. Aware will need covid test prior to procedure. Confirmed mailing address. Will mail instructions with covid test appt.

## 2019-11-26 NOTE — Telephone Encounter (Signed)
lmovm to call back to schedule TCS with Dr. Gala Romney

## 2019-11-26 NOTE — Assessment & Plan Note (Addendum)
Likely secondary to anal fissure vs hemorrhoids in the setting of intermittent constipation. Now resolved s/p [facility] hemorrhoid cream compounded with 0.125% nitroglycerin. Constipation well controlled on MiraLAX as needed. She will continue to monitor for return of symptoms and let us know if this occurs. Of note she does have history of colon polyps and is over due for colonoscopy. Plans to arrange TCS with Dr. Gala Romney in the near future and follow-up as needed.

## 2019-12-04 DIAGNOSIS — E1122 Type 2 diabetes mellitus with diabetic chronic kidney disease: Secondary | ICD-10-CM | POA: Diagnosis not present

## 2019-12-04 DIAGNOSIS — I129 Hypertensive chronic kidney disease with stage 1 through stage 4 chronic kidney disease, or unspecified chronic kidney disease: Secondary | ICD-10-CM | POA: Diagnosis not present

## 2019-12-04 DIAGNOSIS — E7849 Other hyperlipidemia: Secondary | ICD-10-CM | POA: Diagnosis not present

## 2019-12-04 DIAGNOSIS — N182 Chronic kidney disease, stage 2 (mild): Secondary | ICD-10-CM | POA: Diagnosis not present

## 2020-01-20 ENCOUNTER — Telehealth: Payer: Self-pay | Admitting: Internal Medicine

## 2020-01-20 NOTE — Telephone Encounter (Signed)
Called pt. She has been rescheduled to 1/26 at 8:30am. Aware will mail new prep instructions with new covid test appt. Confirmed mailing address. Message sent to endo making aware of appt change.

## 2020-01-20 NOTE — Telephone Encounter (Signed)
[phone] PATIENT NEEDS TO RESCHEDULE HER PROCEDURE

## 2020-01-24 DIAGNOSIS — Z23 Encounter for immunization: Secondary | ICD-10-CM | POA: Diagnosis not present

## 2020-01-27 ENCOUNTER — Other Ambulatory Visit (HOSPITAL_COMMUNITY): Payer: Medicare Other

## 2020-03-03 ENCOUNTER — Other Ambulatory Visit (HOSPITAL_COMMUNITY): Payer: Self-pay | Admitting: Family Medicine

## 2020-03-03 DIAGNOSIS — Z1231 Encounter for screening mammogram for malignant neoplasm of breast: Secondary | ICD-10-CM

## 2020-03-17 ENCOUNTER — Telehealth: Payer: Self-pay | Admitting: *Deleted

## 2020-03-17 NOTE — Telephone Encounter (Signed)
Called pt. She has not been seen since September and has rescheduled already. Patient aware needs OV to r/s procedure. appt scheduled. Called endo and LMOVM to cancel procedure.

## 2020-03-24 ENCOUNTER — Ambulatory Visit (HOSPITAL_COMMUNITY): Payer: No Typology Code available for payment source

## 2020-03-30 ENCOUNTER — Other Ambulatory Visit (HOSPITAL_COMMUNITY): Payer: Medicare Other

## 2020-03-31 ENCOUNTER — Ambulatory Visit (HOSPITAL_COMMUNITY)
Admission: RE | Admit: 2020-03-31 | Payer: No Typology Code available for payment source | Source: Home / Self Care | Admitting: Internal Medicine

## 2020-03-31 ENCOUNTER — Encounter (HOSPITAL_COMMUNITY): Admission: RE | Payer: Self-pay | Source: Home / Self Care

## 2020-03-31 SURGERY — COLONOSCOPY
Anesthesia: Moderate Sedation

## 2020-04-05 ENCOUNTER — Ambulatory Visit (HOSPITAL_COMMUNITY)
Admission: RE | Admit: 2020-04-05 | Discharge: 2020-04-05 | Disposition: A | Discharge status: Home / Self Care | Payer: Medicare Other | Source: Ambulatory Visit | Attending: Family Medicine | Admitting: Family Medicine

## 2020-04-05 ENCOUNTER — Other Ambulatory Visit: Payer: Self-pay

## 2020-04-05 DIAGNOSIS — Z1231 Encounter for screening mammogram for malignant neoplasm of breast: Secondary | ICD-10-CM | POA: Insufficient documentation

## 2020-04-26 NOTE — Progress Notes (Signed)
Referring Provider: Jake Samples, PA* Primary Care Physician:  Jake Samples, PA-C Primary GI Physician: Dr. Gala Romney  Chief Complaint  Patient presents with   Colonoscopy    Wants to reschedule tcs. Not having any rectal bleeding/pain    HPI:   Maria Morrison is a 75 y.o. female presenting today to discuss rescheduling colonoscopy.  Last colonoscopy December 2015 with two 5 mm tubular adenomas, anal papilla, and internal hemorrhoids with recommendations to repeat in 5 years, currently overdue.  She has history of GERD, of alternating constipation and diarrhea, and likely anal fissure and possible hemorrhoids with report of rectal pain and rectal bleeding in July 2021 which began after an episode of constipation.  Symptoms resolved with [facility] hemorrhoid cream compounded with 0.125% nitroglycerin.  Last seen in our office 11/26/2019.  Rectal pain and rectal bleeding resolved.  Bowels moving well.  Only needing MiraLAX sparingly.  Felt constipation was likely secondary to cheese.  GERD fairly well controlled on omeprazole 20 mg daily with rare breakthrough symptoms.  No other significant upper or lower GI symptoms.  Planned to proceed with colonoscopy.  Patient originally scheduled for 11/24 but called to reschedule.  She is rescheduled to 03/31/20, but called to reschedule again.  Recommended office visit to reschedule she had not been seen since September 2021.  Today:  Constipation: No constipation or diarrhea. Cointinues to limit cheese. Bowels move daily to every other day.  Sparingly, she will use MiraLAX if she has skipped a couple days between bowel movements.  No blood in the stool or blacks stools.  No rectal pain.  No unintentional weight loss. No abdominal pain.   GERD: Usually well controlled on omeprazole 20 mg daily. Rare breakthrough symptoms for which she takes an Copywriter, advertising.  Not sure if this contains NSAID products.  No dysphagia.   Past  Medical History:  Diagnosis Date   Diabetes mellitus without complication ([facility])    GERD (gastroesophageal reflux disease)    HLD (hyperlipidemia)    HTN (hypertension)     Past Surgical History:  Procedure Laterality Date   APPENDECTOMY     BACK SURGERY     COLONOSCOPY N/A 02/25/2014   Procedure: COLONOSCOPY;  Surgeon: Daneil Dolin, MD;  two tubular adenomas, anal papilla, internal hemorrhoids.  Recommended repeat colonoscopy in 5 years.   TUBAL LIGATION      Current Outpatient Medications  Medication Sig Dispense Refill   acetaminophen (TYLENOL) 500 MG tablet Take 1,000 mg by mouth every 6 (six) hours as needed for moderate pain (for back and knee pain).     aspirin EC 81 MG tablet Take 81 mg by mouth as needed. Takes most days     atorvastatin (LIPITOR) 10 MG tablet Take 10 mg by mouth daily.     lisinopril (PRINIVIL,ZESTRIL) 10 MG tablet Take 10 mg by mouth daily.     Melatonin 3 MG CAPS Take 3 mg by mouth at bedtime.     metFORMIN (GLUCOPHAGE) 500 MG tablet Take 500 mg by mouth daily with breakfast.     omeprazole (PRILOSEC OTC) 20 MG tablet Take 20 mg by mouth daily.     polyethylene glycol (MIRALAX / GLYCOLAX) packet Take 17 g by mouth as needed.      No current facility-administered medications for this visit.    Allergies as of 04/28/2020 - Review Complete 04/28/2020  Allergen Reaction Noted   Codeine Nausea Only 02/11/2014    Family History  Problem Relation Age of Onset   Stroke Mother    Parkinson's disease Father    Stroke Maternal Grandfather    Stroke Paternal Grandfather    Stroke Paternal Grandmother    Colon cancer Neg Hx     Social History   Socioeconomic History   Marital status: Married    Spouse name: [name]   Number of children: 0   Years of education: 13   Highest education level: Not on file  Occupational History    Comment: retired  Tobacco Use   Smoking status: Former Smoker   Smokeless tobacco: Never  Used  Substance and Sexual Activity   Alcohol use: No    Alcohol/week: 0.0 standard drinks   Drug use: No   Sexual activity: Not on file  Other Topics Concern   Not on file  Social History Narrative   Consumes 3-4 cans of soda daily   Social Determinants of Health   Financial Resource Strain: Not on file  Food Insecurity: Not on file  Transportation Needs: Not on file  Physical Activity: Not on file  Stress: Not on file  Social Connections: Not on file    Review of Systems: Gen: Denies fever, chills, cold or flulike symptoms, lightheadedness, dizziness, presyncope, syncope. CV: Denies chest pain or palpitations. Resp: Denies dyspnea or cough. GI: See HPI Heme: See HPI  Physical Exam: BP (!) 136/59    Pulse 67    Temp (!) 96.9 F (36.1 C) (Temporal)    Ht 5\' 2"  (1.575 m)    Wt 207 lb (93.9 kg)    BMI 37.86 kg/m  General:   Alert and oriented. No distress noted. Pleasant and cooperative.  Head:  Normocephalic and atraumatic. Eyes:  Conjuctiva clear without scleral icterus. Heart:  S1, S2 present without murmurs appreciated. Lungs:  Clear to auscultation bilaterally. No wheezes, rales, or rhonchi. No distress.  Abdomen:  +BS, soft, non-tender and non-distended. No rebound or guarding. No HSM or masses noted. Msk:  Symmetrical without gross deformities. Normal posture. Extremities:  Without edema. Neurologic:  Alert and  oriented x4 Psych: Normal mood and affect.

## 2020-04-28 ENCOUNTER — Encounter: Payer: Self-pay | Admitting: *Deleted

## 2020-04-28 ENCOUNTER — Encounter: Payer: Self-pay | Admitting: Gastroenterology

## 2020-04-28 ENCOUNTER — Telehealth (INDEPENDENT_AMBULATORY_CARE_PROVIDER_SITE_OTHER): Payer: Medicare Other | Admitting: Gastroenterology

## 2020-04-28 ENCOUNTER — Other Ambulatory Visit: Payer: Self-pay

## 2020-04-28 VITALS — BP 136/59 | HR 67 | Temp 96.9°F | Ht 62.0 in | Wt 207.0 lb

## 2020-04-28 DIAGNOSIS — K219 Gastro-esophageal reflux disease without esophagitis: Secondary | ICD-10-CM | POA: Insufficient documentation

## 2020-04-28 DIAGNOSIS — K5900 Constipation, unspecified: Secondary | ICD-10-CM

## 2020-04-28 DIAGNOSIS — Z86010 Personal history of colonic polyps: Secondary | ICD-10-CM | POA: Diagnosis not present

## 2020-04-28 MED ORDER — PEG 3350-KCL-NA BICARB-NACL 420 G PO SOLR: ORAL | 0 refills | Status: DC

## 2020-04-28 NOTE — Assessment & Plan Note (Signed)
75 year old female with history of adenomatous colon polyps, overdue for surveillance colonoscopy.  Last colonoscopy in December 2015 with two 5 mm tubular adenomas, anal papilla, and internal hemorrhoids with recommendations to repeat in 5 years.  History of mild constipation which is fairly well-controlled with dietary adjustments.  Sparingly, she will use MiraLAX which seems to be working well for her.  No alarm symptoms.  Notably, she had experienced rectal pain and low-volume rectal bleeding back in July 2021.  Symptoms resolved with [facility] hemorrhoid cream compounded with 0.125% nitroglycerin.  Plan: Proceed with colonoscopy with Dr. Gala Romney in the near future. The risks, benefits, and alternatives have been discussed with the patient in detail. The patient states understanding and desires to proceed.  ASA II No diabetes medications the morning of procedure. Due to mild intermittent constipation, advise she go ahead and take MiraLAX 17 g every evening for 1 week prior to the start of her colon prep to ensure bowels are moving well. Follow-up in 6 months.

## 2020-04-28 NOTE — Patient Instructions (Signed)
We will arrange for you to have a colonoscopy in the near future with Dr. Gala Romney. 1 day prior to procedure: You can continue taking your medications as prescribed. Day of procedure: Do not take any morning diabetes medications. 1 week prior to the start of your colon prep, start taking 17 g of MiraLAX every evening to ensure your bowels are moving well.  For mild intermittent constipation: Start Benefiber 2 teaspoons daily x1 week.  You may increase this to 3 times daily as tolerated. Continue to use MiraLAX 17 g in 8 ounces of water as needed for breakthrough constipation.  For acid reflux:  Continue taking omeprazole 20 mg daily 30 minutes for breakfast. Follow a GERD diet:  Avoid fried, fatty, greasy, spicy, citrus foods. Avoid caffeine and carbonated beverages. Avoid chocolate. Try eating 4-6 small meals a day rather than 3 large meals. Do not eat within 3 hours of laying down. Prop head of bed up on wood or bricks to create a 6 inch incline.  We will plan to see you back in 6 months.  Do not hesitate to call if you have any questions or concerns prior.  Aliene Altes, PA-C [facility]

## 2020-04-28 NOTE — Assessment & Plan Note (Addendum)
In general, bowels are moving well with dietary adjustments and avoiding cheese.  Occasionally, she will use MiraLAX, but this is sparingly.  No alarm symptoms. I have advised that she add Benefiber 2 teaspoons daily x1 week and increase up to 3 times daily as tolerated.  Continue to use MiraLAX 17 g in 8 ounces of water as needed for breakthrough constipation.  Follow-up in 6 months.

## 2020-04-28 NOTE — Assessment & Plan Note (Signed)
Chronic.  Fairly well controlled on omeprazole 20 mg daily.  Rare breakthrough symptoms for which she uses Alka-Seltzer.  Advise she continue her current medications.  Check to see that Alka-Seltzer does not contain NSAIDs.  Advised to use Tums or famotidine as needed. Counseled on GERD diet/lifestyle and written information was provided.  Follow-up in 6 months.

## 2020-06-02 DIAGNOSIS — E1122 Type 2 diabetes mellitus with diabetic chronic kidney disease: Secondary | ICD-10-CM | POA: Diagnosis not present

## 2020-06-02 DIAGNOSIS — N182 Chronic kidney disease, stage 2 (mild): Secondary | ICD-10-CM | POA: Diagnosis not present

## 2020-06-02 DIAGNOSIS — I129 Hypertensive chronic kidney disease with stage 1 through stage 4 chronic kidney disease, or unspecified chronic kidney disease: Secondary | ICD-10-CM | POA: Diagnosis not present

## 2020-06-02 DIAGNOSIS — E7849 Other hyperlipidemia: Secondary | ICD-10-CM | POA: Diagnosis not present

## 2020-06-14 ENCOUNTER — Other Ambulatory Visit (HOSPITAL_COMMUNITY)
Admission: RE | Admit: 2020-06-14 | Discharge: 2020-06-14 | Disposition: A | Discharge status: Home / Self Care | Payer: Medicare Other | Source: Ambulatory Visit | Attending: Internal Medicine | Admitting: Internal Medicine

## 2020-06-14 ENCOUNTER — Other Ambulatory Visit: Payer: Self-pay

## 2020-06-14 DIAGNOSIS — Z01812 Encounter for preprocedural laboratory examination: Secondary | ICD-10-CM | POA: Insufficient documentation

## 2020-06-14 DIAGNOSIS — Z20822 Contact with and (suspected) exposure to covid-19: Secondary | ICD-10-CM | POA: Insufficient documentation

## 2020-06-14 LAB — SARS CORONAVIRUS 2 (TAT 6-24 HRS): SARS Coronavirus 2: NEGATIVE

## 2020-06-16 ENCOUNTER — Encounter (HOSPITAL_COMMUNITY): Payer: Self-pay | Admitting: Internal Medicine

## 2020-06-16 ENCOUNTER — Other Ambulatory Visit: Payer: Self-pay

## 2020-06-16 ENCOUNTER — Encounter (HOSPITAL_COMMUNITY)
Admission: RE | Disposition: A | Discharge status: Home / Self Care | Payer: Self-pay | Source: Home / Self Care | Attending: Internal Medicine

## 2020-06-16 ENCOUNTER — Ambulatory Visit (HOSPITAL_COMMUNITY)
Admission: RE | Admit: 2020-06-16 | Discharge: 2020-06-16 | Disposition: A | Discharge status: Home / Self Care | Payer: Medicare Other | Attending: Internal Medicine | Admitting: Internal Medicine

## 2020-06-16 DIAGNOSIS — Z7984 Long term (current) use of oral hypoglycemic drugs: Secondary | ICD-10-CM | POA: Insufficient documentation

## 2020-06-16 DIAGNOSIS — Z1211 Encounter for screening for malignant neoplasm of colon: Secondary | ICD-10-CM | POA: Insufficient documentation

## 2020-06-16 DIAGNOSIS — Z86010 Personal history of colonic polyps: Secondary | ICD-10-CM

## 2020-06-16 DIAGNOSIS — Z7982 Long term (current) use of aspirin: Secondary | ICD-10-CM | POA: Insufficient documentation

## 2020-06-16 DIAGNOSIS — Z8719 Personal history of other diseases of the digestive system: Secondary | ICD-10-CM | POA: Diagnosis not present

## 2020-06-16 DIAGNOSIS — Z79899 Other long term (current) drug therapy: Secondary | ICD-10-CM | POA: Insufficient documentation

## 2020-06-16 DIAGNOSIS — Z87891 Personal history of nicotine dependence: Secondary | ICD-10-CM | POA: Diagnosis not present

## 2020-06-16 HISTORY — PX: COLONOSCOPY: SHX5424

## 2020-06-16 LAB — GLUCOSE, CAPILLARY: Glucose-Capillary: 112 mg/dL — ABNORMAL HIGH (ref 70–99)

## 2020-06-16 SURGERY — COLONOSCOPY
Anesthesia: Moderate Sedation

## 2020-06-16 MED ORDER — ONDANSETRON HCL 4 MG/2ML IJ SOLN
INTRAMUSCULAR | Status: DC | PRN
  Administered 2020-06-16: 4 mg via INTRAVENOUS

## 2020-06-16 MED ORDER — MIDAZOLAM HCL 5 MG/5ML IJ SOLN
INTRAMUSCULAR | Status: AC
  Filled 2020-06-16: qty 10

## 2020-06-16 MED ORDER — STERILE WATER FOR IRRIGATION IR SOLN
Status: DC | PRN
  Administered 2020-06-16: 200 mL

## 2020-06-16 MED ORDER — ONDANSETRON HCL 4 MG/2ML IJ SOLN
INTRAMUSCULAR | Status: AC
  Filled 2020-06-16: qty 2

## 2020-06-16 MED ORDER — SODIUM CHLORIDE 0.9 % IV SOLN
INTRAVENOUS | Status: DC
  Administered 2020-06-16: 1000 mL via INTRAVENOUS

## 2020-06-16 MED ORDER — MEPERIDINE HCL 50 MG/ML IJ SOLN
INTRAMUSCULAR | Status: DC
Start: 2020-06-16 — End: 2020-06-16
  Filled 2020-06-16: qty 1

## 2020-06-16 MED ORDER — MEPERIDINE HCL 100 MG/ML IJ SOLN
INTRAMUSCULAR | Status: DC | PRN
Start: 2020-06-16 — End: 2020-06-16
  Administered 2020-06-16: 40 mg via INTRAVENOUS

## 2020-06-16 MED ORDER — MIDAZOLAM HCL 5 MG/5ML IJ SOLN
INTRAMUSCULAR | Status: DC | PRN
  Administered 2020-06-16 (×4): 1 mg via INTRAVENOUS
  Administered 2020-06-16: 2 mg via INTRAVENOUS

## 2020-06-16 NOTE — Discharge Instructions (Signed)
  Colonoscopy Discharge Instructions  Read the instructions outlined below and refer to this sheet in the next few weeks. These discharge instructions provide you with general information on caring for yourself after you leave the hospital. Your doctor may also give you specific instructions. While your treatment has been planned according to the most current medical practices available, unavoidable complications occasionally occur. If you have any problems or questions after discharge, call Dr. Gala Romney at [phone]. ACTIVITY  You may resume your regular activity, but move at a slower pace for the next 24 hours.   Take frequent rest periods for the next 24 hours.   Walking will help get rid of the air and reduce the bloated feeling in your belly (abdomen).   No driving for 24 hours (because of the medicine (anesthesia) used during the test).    Do not sign any important legal documents or operate any machinery for 24 hours (because of the anesthesia used during the test).  NUTRITION  Drink plenty of fluids.   You may resume your normal diet as instructed by your doctor.   Begin with a light meal and progress to your normal diet. Heavy or fried foods are harder to digest and may make you feel sick to your stomach (nauseated).   Avoid alcoholic beverages for 24 hours or as instructed.  MEDICATIONS  You may resume your normal medications unless your doctor tells you otherwise.  WHAT YOU CAN EXPECT TODAY  Some feelings of bloating in the abdomen.   Passage of more gas than usual.   Spotting of blood in your stool or on the toilet paper.  IF YOU HAD POLYPS REMOVED DURING THE COLONOSCOPY:  No aspirin products for 7 days or as instructed.   No alcohol for 7 days or as instructed.   Eat a soft diet for the next 24 hours.  FINDING OUT THE RESULTS OF YOUR TEST Not all test results are available during your visit. If your test results are not back during the visit, make an appointment  with your caregiver to find out the results. Do not assume everything is normal if you have not heard from your caregiver or the medical facility. It is important for you to follow up on all of your test results.  SEEK IMMEDIATE MEDICAL ATTENTION IF:  You have more than a spotting of blood in your stool.   Your belly is swollen (abdominal distention).   You are nauseated or vomiting.   You have a temperature over 101.   You have abdominal pain or discomfort that is severe or gets worse throughout the day.    Your colonoscopy was normal today  A future colonoscopy is not recommended unless new symptoms develop  At patient request, I called [facility] at [phone] reviewed results

## 2020-06-16 NOTE — Op Note (Signed)
[facility] Patient Name: Maria Morrison Procedure Date: 06/16/2020 9:47 AM MRN: [id number] Date of Birth: 09-17-1945 Attending MD: Norvel Richards , MD CSN: [id number] Age: 75 Admit Type: Outpatient Procedure:                Colonoscopy Indications:              High risk colon cancer surveillance: Personal                            history of colonic polyps Providers:                Norvel Richards, MD, Lurline Del, RN, Wynonia Musty Tech, Technician Referring MD:              Medicines:                Midazolam 6 mg IV, Meperidine 40 mg IV Complications:            No immediate complications. Estimated Blood Loss:     Estimated blood loss: none. Procedure:                Pre-Anesthesia Assessment:                           - Prior to the procedure, a History and Physical                            was performed, and patient medications and                            allergies were reviewed. The patient's tolerance of                            previous anesthesia was also reviewed. The risks                            and benefits of the procedure and the sedation                            options and risks were discussed with the patient.                            All questions were answered, and informed consent                            was obtained. Prior Anticoagulants: The patient has                            taken no previous anticoagulant or antiplatelet                            agents. ASA Grade Assessment: II - A patient with  mild systemic disease. After reviewing the risks                            and benefits, the patient was deemed in                            satisfactory condition to undergo the procedure.                           After obtaining informed consent, the colonoscope                            was passed under direct vision. Throughout the                             procedure, the patient's blood pressure, pulse, and                            oxygen saturations were monitored continuously. The                            [id number]-[id number] ([id number]) scope was introduced through                            the anus and advanced to the the cecum, identified                            by appendiceal orifice and ileocecal valve. The                            ileocecal valve, appendiceal orifice, and rectum                            were photographed. The ileocecal valve, appendiceal                            orifice, and rectum were photographed. Scope In: 10:15:38 AM Scope Out: 10:28:55 AM Scope Withdrawal Time: 0 hours 7 minutes 40 seconds  Total Procedure Duration: 0 hours 13 minutes 17 seconds  Findings:      The perianal and digital rectal examinations were normal.      The colon (entire examined portion) appeared normal.      The retroflexed view of the distal rectum and anal verge was normal and       showed no anal or rectal abnormalities. Impression:               - The entire examined colon is normal.                           - The distal rectum and anal verge are normal on                            retroflexion view.                           -  No specimens collected. Moderate Sedation:      Moderate (conscious) sedation was administered by the endoscopy nurse       and supervised by the endoscopist. The following parameters were       monitored: oxygen saturation, heart rate, blood pressure, respiratory       rate, EKG, adequacy of pulmonary ventilation, and response to care.       Total physician intraservice time was 16 minutes. Recommendation:           - Patient has a contact number available for                            emergencies. The signs and symptoms of potential                            delayed complications were discussed with the                            patient. Return to normal activities tomorrow.                             Written discharge instructions were provided to the                            patient.                           - Resume previous diet.                           - No repeat colonoscopy due to age.                           - Return to GI clinic (date not yet determined). Procedure Code(s):        --- Professional ---                           [phone], Colonoscopy, flexible; diagnostic, including                            collection of specimen(s) by brushing or washing,                            when performed (separate procedure)                           G0500, Moderate sedation services provided by the                            same physician or other qualified health care                            professional performing a gastrointestinal                            endoscopic service that sedation supports,  requiring the presence of an independent trained                            observer to assist in the monitoring of the                            patient's level of consciousness and physiological                            status; initial 15 minutes of intra-service time;                            patient age 80 years or older (additional time may                            be reported with [phone], as appropriate) Diagnosis Code(s):        --- Professional ---                           Z86.010, Personal history of colonic polyps CPT copyright 2019 American Medical Association. All rights reserved. The codes documented in this report are preliminary and upon coder review may  be revised to meet current compliance requirements. Cristopher Estimable. Aldahir Litaker, MD Norvel Richards, MD 06/16/2020 10:42:55 AM This report has been signed electronically. Number of Addenda: 0

## 2020-06-16 NOTE — H&P (Signed)
@LOGO @   Primary Care Physician:  Jake Samples, PA-C Primary Gastroenterologist:  Dr. Gala Romney  Pre-Procedure History & Physical: HPI:  Maria Morrison is a 75 y.o. female here for surveillance colonoscopy.  History of multiple colonic adenomas removed previously.  Somewhat overdue due to the pandemic.   Past Medical History:  Diagnosis Date  . Diabetes mellitus without complication ([place])   . GERD (gastroesophageal reflux disease)   . HLD (hyperlipidemia)   . HTN (hypertension)     Past Surgical History:  Procedure Laterality Date  . APPENDECTOMY    . BACK SURGERY    . COLONOSCOPY N/A 02/25/2014   Procedure: COLONOSCOPY;  Surgeon: Daneil Dolin, MD;  two tubular adenomas, anal papilla, internal hemorrhoids.  Recommended repeat colonoscopy in 5 years.  . TUBAL LIGATION      Prior to Admission medications   Medication Sig Start Date End Date Taking? Authorizing Provider  acetaminophen (TYLENOL) 500 MG tablet Take 1,000 mg by mouth every 6 (six) hours as needed for moderate pain (for back and knee pain).   Yes [provider]  aspirin EC 81 MG tablet Take 81 mg by mouth 3 (three) times a week. Takes most days   Yes [provider]  atorvastatin (LIPITOR) 10 MG tablet Take 10 mg by mouth at bedtime.   Yes [provider]  lisinopril (PRINIVIL,ZESTRIL) 10 MG tablet Take 10 mg by mouth in the morning.   Yes [provider]  Melatonin 3 MG CAPS Take 3 mg by mouth at bedtime.   Yes [provider]  metFORMIN (GLUCOPHAGE) 500 MG tablet Take 500 mg by mouth at bedtime.   Yes [provider]  omeprazole (PRILOSEC OTC) 20 MG tablet Take 20 mg by mouth in the morning.   Yes [provider]  polyethylene glycol (MIRALAX / GLYCOLAX) packet Take 17 g by mouth daily as needed (constipation).   Yes [provider]  polyethylene glycol-electrolytes (NULYTELY) 420 g solution As directed Patient taking differently: Take  4,000 mLs by mouth as directed. 04/28/20   Daneil Dolin, MD    Allergies as of 04/28/2020 - Review Complete 04/28/2020  Allergen Reaction Noted  . Codeine Nausea Only 02/11/2014    Family History  Problem Relation Age of Onset  . Stroke Mother   . Parkinson's disease Father   . Stroke Maternal Grandfather   . Stroke Paternal Grandfather   . Stroke Paternal Grandmother   . Colon cancer Neg Hx     Social History   Socioeconomic History  . Marital status: Married    Spouse name: [name]  . Number of children: 0  . Years of education: 74  . Highest education level: Not on file  Occupational History    Comment: retired  Tobacco Use  . Smoking status: Former Research scientist (life sciences)  . Smokeless tobacco: Never Used  Substance and Sexual Activity  . Alcohol use: No    Alcohol/week: 0.0 standard drinks  . Drug use: No  . Sexual activity: Not on file  Other Topics Concern  . Not on file  Social History Narrative   Consumes 3-4 cans of soda daily   Social Determinants of Health   Financial Resource Strain: Not on file  Food Insecurity: Not on file  Transportation Needs: Not on file  Physical Activity: Not on file  Stress: Not on file  Social Connections: Not on file  Intimate Partner Violence: Not on file    Review of Systems: See HPI, otherwise  negative ROS  Physical Exam: BP 137/66   Pulse 85   Temp 98.2 F (36.8 C) (Oral)   Resp 15   Ht 5' 2.5" (1.588 m)   Wt 86.2 kg   SpO2 95%   BMI 34.20 kg/m  General:   Alert,  Well-developed, well-nourished, pleasant and cooperative in NAD Neck:  Supple; no masses or thyromegaly. No significant cervical adenopathy. Lungs:  Clear throughout to auscultation.   No wheezes, crackles, or rhonchi. No acute distress. Heart:  Regular rate and rhythm; no murmurs, clicks, rubs,  or gallops. Abdomen: Non-distended, normal bowel sounds.  Soft and nontender without appreciable mass or hepatosplenomegaly.  Pulses:  Normal pulses  noted. Extremities:  Without clubbing or edema.  Impression/Plan: 75 year old lady here for surveillance colonoscopy.  History of multiple colonic adenomas removed previously. The risks, benefits, limitations, alternatives and imponderables have been reviewed with the patient. Questions have been answered. All parties are agreeable.      Notice: This dictation was prepared with Dragon dictation along with smaller phrase technology. Any transcriptional errors that result from this process are unintentional and may not be corrected upon review.

## 2020-06-20 ENCOUNTER — Other Ambulatory Visit: Payer: Self-pay

## 2020-06-20 ENCOUNTER — Ambulatory Visit
Admission: EM | Admit: 2020-06-20 | Discharge: 2020-06-20 | Disposition: A | Discharge status: Home / Self Care | Payer: Medicare Other | Attending: Family Medicine | Admitting: Family Medicine

## 2020-06-20 ENCOUNTER — Encounter: Payer: Self-pay | Admitting: Emergency Medicine

## 2020-06-20 DIAGNOSIS — B028 Zoster with other complications: Secondary | ICD-10-CM

## 2020-06-20 DIAGNOSIS — R519 Headache, unspecified: Secondary | ICD-10-CM | POA: Diagnosis not present

## 2020-06-20 DIAGNOSIS — B0239 Other herpes zoster eye disease: Secondary | ICD-10-CM | POA: Diagnosis not present

## 2020-06-20 MED ORDER — PREDNISONE 20 MG PO TABS: 40.0000 mg | ORAL_TABLET | Freq: Every day | ORAL | 0 refills | Status: AC

## 2020-06-20 MED ORDER — DEXAMETHASONE SODIUM PHOSPHATE 10 MG/ML IJ SOLN
10.0000 mg | Freq: Once | INTRAMUSCULAR | Status: AC
  Administered 2020-06-20: 10 mg via INTRAMUSCULAR

## 2020-06-20 MED ORDER — VALACYCLOVIR HCL 1 G PO TABS: 1000.0000 mg | ORAL_TABLET | Freq: Three times a day (TID) | ORAL | 0 refills | Status: AC

## 2020-06-20 MED ORDER — GABAPENTIN 100 MG PO CAPS: 100.0000 mg | ORAL_CAPSULE | Freq: Two times a day (BID) | ORAL | 0 refills | Status: DC | PRN

## 2020-06-20 NOTE — Discharge Instructions (Addendum)
Start Valacyclovir today to treat shingle virus. Take Gabapentin 100 mg up to twice daily for facial pain-medication causes severe drowsiness, do not cook or drive while taking medication. Start prednisone tomorrow, you received a steroid shot here at urgent care for today.  Follow-up with your primary care doctor tomorrow to schedule a visit. If you experience any visual problems or drainage from eye, contact the ophthalmology office list for emergent evaluation.

## 2020-06-20 NOTE — ED Provider Notes (Signed)
[facility]    CSN: [id number] Arrival date & time: 06/20/20  1126      History   Chief Complaint No chief complaint on file.   HPI Maria Morrison is a 75 y.o. female.   HPI  Patient presents today with 4 days of a gradually progressive rash involving the left side of her face and left eyelids and left scalp. Patient reports rash feels like pins-and-needles and has precipitated a headache which is extending down to her cervical neck region.  All pain is localized to the right side.  She grew concerned as she noticed that the rash is now progressed to involve her upper left eyelid.  She denies any current visual changes.  She reports recent stress as she had to undergo a bowel prep and had a colonoscopy this week and wonders if this could have precipitated shingles outbreak.  She reports a history of shingles twice and has never received a shingles vaccine.  Past Medical History:  Diagnosis Date  . Diabetes mellitus without complication ([place])   . GERD (gastroesophageal reflux disease)   . HLD (hyperlipidemia)   . HTN (hypertension)     Patient Active Problem List   Diagnosis Date Noted  . GERD (gastroesophageal reflux disease) 04/28/2020  . Rectal bleeding 09/24/2019  . Constipation 09/24/2019  . Rectal pain 09/24/2019  . History of colonic polyps     Past Surgical History:  Procedure Laterality Date  . APPENDECTOMY    . BACK SURGERY    . COLONOSCOPY N/A 02/25/2014   Procedure: COLONOSCOPY;  Surgeon: Daneil Dolin, MD;  two tubular adenomas, anal papilla, internal hemorrhoids.  Recommended repeat colonoscopy in 5 years.  . COLONOSCOPY N/A 06/16/2020   Procedure: COLONOSCOPY;  Surgeon: Daneil Dolin, MD;  Location: [facility];  Service: Endoscopy;  Laterality: N/A;  am appt  . TUBAL LIGATION      OB History   No obstetric history on file.      Home Medications    Prior to Admission medications   Medication Sig Start Date End Date Taking?  Authorizing Provider  gabapentin (NEURONTIN) 100 MG capsule Take 1 capsule (100 mg total) by mouth 2 (two) times daily as needed for up to 10 days (Avoid driving and take as needed for pain). 06/20/20 06/30/20 Yes Scot Jun, FNP  predniSONE (DELTASONE) 20 MG tablet Take 2 tablets (40 mg total) by mouth daily with breakfast for 5 days. 06/21/20 06/26/20 Yes Scot Jun, FNP  valACYclovir (VALTREX) 1000 MG tablet Take 1 tablet (1,000 mg total) by mouth 3 (three) times daily for 7 days. 06/20/20 06/27/20 Yes Scot Jun, FNP  acetaminophen (TYLENOL) 500 MG tablet Take 1,000 mg by mouth every 6 (six) hours as needed for moderate pain (for back and knee pain).    [provider]  aspirin EC 81 MG tablet Take 81 mg by mouth 3 (three) times a week. Takes most days    [provider]  atorvastatin (LIPITOR) 10 MG tablet Take 10 mg by mouth at bedtime.    [provider]  lisinopril (PRINIVIL,ZESTRIL) 10 MG tablet Take 10 mg by mouth in the morning.    [provider]  Melatonin 3 MG CAPS Take 3 mg by mouth at bedtime.    [provider]  metFORMIN (GLUCOPHAGE) 500 MG tablet Take 500 mg by mouth at bedtime.    [provider]  omeprazole (PRILOSEC OTC) 20 MG tablet Take 20 mg  by mouth in the morning.    [provider]  polyethylene glycol (MIRALAX / GLYCOLAX) packet Take 17 g by mouth daily as needed (constipation).    [provider]  polyethylene glycol-electrolytes (NULYTELY) 420 g solution As directed Patient taking differently: Take 4,000 mLs by mouth as directed. 04/28/20   Rourk, Cristopher Estimable, MD    Family History Family History  Problem Relation Age of Onset  . Stroke Mother   . Parkinson's disease Father   . Stroke Maternal Grandfather   . Stroke Paternal Grandfather   . Stroke Paternal Grandmother   . Colon cancer Neg Hx     Social History Social History   Tobacco Use  . Smoking status: Former Research scientist (life sciences)   . Smokeless tobacco: Never Used  Substance Use Topics  . Alcohol use: No    Alcohol/week: 0.0 standard drinks  . Drug use: No     Allergies   Codeine   Review of Systems Review of Systems  Pertinent negatives listed in HPI  Physical Exam Triage Vital Signs ED Triage Vitals  Enc Vitals Group     BP 06/20/20 1144 (!) 182/79     Pulse Rate 06/20/20 1144 79     Resp 06/20/20 1144 16     Temp 06/20/20 1144 98.3 F (36.8 C)     Temp Source 06/20/20 1144 Oral     SpO2 06/20/20 1144 96 %     Weight --      Height --      Head Circumference --      Peak Flow --      Pain Score 06/20/20 1149 5     Pain Loc --      Pain Edu? --      Excl. in [place]? --    No data found.  Updated Vital Signs BP (!) 182/79 (BP Location: Right Arm)   Pulse 79   Temp 98.3 F (36.8 C) (Oral)   Resp 16   SpO2 96%   Visual Acuity Right Eye Distance:   Left Eye Distance:   Bilateral Distance:    Right Eye Near:   Left Eye Near:    Bilateral Near:     Physical Exam Constitutional:      Appearance: She is ill-appearing.  HENT:     Head:   Cardiovascular:     Rate and Rhythm: Normal rate and regular rhythm.  Pulmonary:     Effort: Pulmonary effort is normal.     Breath sounds: Normal breath sounds and air entry.  Musculoskeletal:     Cervical back: Normal range of motion and neck supple.  Neurological:     Mental Status: She is alert.     GCS: GCS eye subscore is 4. GCS verbal subscore is 5. GCS motor subscore is 6.     Cranial Nerves: Cranial nerves are intact.  Psychiatric:        Attention and Perception: Attention normal.        Mood and Affect: Mood normal.        Speech: Speech normal.        Cognition and Memory: Cognition normal.        Judgment: Judgment normal.    UC Treatments / Results  Labs (all labs ordered are listed, but only abnormal results are displayed) Labs Reviewed - No data to display  EKG   Radiology No results found.  Procedures Procedures  (including critical care time)  Medications Ordered in UC Medications  dexamethasone (DECADRON) injection 10 mg (10 mg Intramuscular Given 06/20/20 1217)    Initial Impression / Assessment and Plan / UC Course  I have reviewed the triage vital signs and the nursing notes.  Pertinent labs & imaging results that were available during my care of the patient were reviewed by me and considered in my medical decision making (see chart for details).    Shingle infection complicated given close proximity to the left eye as rash is distributed on upper eye lid. Start Valtrex and prednisone along with Gabapentin for pain. Follow-up instructions given for opthalmology and PCP. ER if symptoms worsen. Final Clinical Impressions(s) / UC Diagnoses   Final diagnoses:  Herpes zoster with other complication  Shingles of eyelid left  Left-sided headache     Discharge Instructions     Start Valacyclovir today to treat shingle virus. Take Gabapentin 100 mg up to twice daily for facial pain-medication causes severe drowsiness, do not cook or drive while taking medication. Start prednisone tomorrow, you received a steroid shot here at urgent care for today.  Follow-up with your primary care doctor tomorrow to schedule a visit. If you experience any visual problems or drainage from eye, contact the ophthalmology office list for emergent evaluation.     ED Prescriptions    Medication Sig Dispense Auth. Provider   valACYclovir (VALTREX) 1000 MG tablet Take 1 tablet (1,000 mg total) by mouth 3 (three) times daily for 7 days. 21 tablet Scot Jun, FNP   predniSONE (DELTASONE) 20 MG tablet Take 2 tablets (40 mg total) by mouth daily with breakfast for 5 days. 10 tablet Scot Jun, FNP   gabapentin (NEURONTIN) 100 MG capsule Take 1 capsule (100 mg total) by mouth 2 (two) times daily as needed for up to 10 days (Avoid driving and take as needed for pain). 20 capsule Scot Jun, FNP      PDMP not reviewed this encounter.   Scot Jun,  06/24/20 [phone]

## 2020-06-20 NOTE — ED Triage Notes (Signed)
Pain started at back of neck on Tuesday.  Pain radiated around and up to head.  Rash on left side of face on Thursday.  Painful rash.  States has a headache.

## 2020-06-21 ENCOUNTER — Encounter (HOSPITAL_COMMUNITY): Payer: Self-pay | Admitting: Internal Medicine

## 2020-06-24 DIAGNOSIS — B0230 Zoster ocular disease, unspecified: Secondary | ICD-10-CM | POA: Diagnosis not present

## 2020-06-28 DIAGNOSIS — E7849 Other hyperlipidemia: Secondary | ICD-10-CM | POA: Diagnosis not present

## 2020-06-28 DIAGNOSIS — Z6838 Body mass index (BMI) 38.0-38.9, adult: Secondary | ICD-10-CM | POA: Diagnosis not present

## 2020-06-28 DIAGNOSIS — E119 Type 2 diabetes mellitus without complications: Secondary | ICD-10-CM | POA: Diagnosis not present

## 2020-06-28 DIAGNOSIS — Z1389 Encounter for screening for other disorder: Secondary | ICD-10-CM | POA: Diagnosis not present

## 2020-06-28 DIAGNOSIS — Z1331 Encounter for screening for depression: Secondary | ICD-10-CM | POA: Diagnosis not present

## 2020-06-28 DIAGNOSIS — I10 Essential (primary) hypertension: Secondary | ICD-10-CM | POA: Diagnosis not present

## 2020-07-01 DIAGNOSIS — B0239 Other herpes zoster eye disease: Secondary | ICD-10-CM | POA: Diagnosis not present

## 2020-07-03 DIAGNOSIS — E7849 Other hyperlipidemia: Secondary | ICD-10-CM | POA: Diagnosis not present

## 2020-07-03 DIAGNOSIS — I129 Hypertensive chronic kidney disease with stage 1 through stage 4 chronic kidney disease, or unspecified chronic kidney disease: Secondary | ICD-10-CM | POA: Diagnosis not present

## 2020-07-03 DIAGNOSIS — N182 Chronic kidney disease, stage 2 (mild): Secondary | ICD-10-CM | POA: Diagnosis not present

## 2020-07-03 DIAGNOSIS — E1122 Type 2 diabetes mellitus with diabetic chronic kidney disease: Secondary | ICD-10-CM | POA: Diagnosis not present

## 2020-08-03 DIAGNOSIS — N182 Chronic kidney disease, stage 2 (mild): Secondary | ICD-10-CM | POA: Diagnosis not present

## 2020-08-03 DIAGNOSIS — E7849 Other hyperlipidemia: Secondary | ICD-10-CM | POA: Diagnosis not present

## 2020-08-03 DIAGNOSIS — E1122 Type 2 diabetes mellitus with diabetic chronic kidney disease: Secondary | ICD-10-CM | POA: Diagnosis not present

## 2020-08-03 DIAGNOSIS — I129 Hypertensive chronic kidney disease with stage 1 through stage 4 chronic kidney disease, or unspecified chronic kidney disease: Secondary | ICD-10-CM | POA: Diagnosis not present

## 2020-09-02 DIAGNOSIS — E1122 Type 2 diabetes mellitus with diabetic chronic kidney disease: Secondary | ICD-10-CM | POA: Diagnosis not present

## 2020-09-02 DIAGNOSIS — N182 Chronic kidney disease, stage 2 (mild): Secondary | ICD-10-CM | POA: Diagnosis not present

## 2020-09-02 DIAGNOSIS — I129 Hypertensive chronic kidney disease with stage 1 through stage 4 chronic kidney disease, or unspecified chronic kidney disease: Secondary | ICD-10-CM | POA: Diagnosis not present

## 2020-09-02 DIAGNOSIS — E782 Mixed hyperlipidemia: Secondary | ICD-10-CM | POA: Diagnosis not present

## 2020-09-15 DIAGNOSIS — E119 Type 2 diabetes mellitus without complications: Secondary | ICD-10-CM | POA: Diagnosis not present

## 2020-09-15 DIAGNOSIS — H26493 Other secondary cataract, bilateral: Secondary | ICD-10-CM | POA: Diagnosis not present

## 2020-09-15 DIAGNOSIS — H524 Presbyopia: Secondary | ICD-10-CM | POA: Diagnosis not present

## 2020-09-15 DIAGNOSIS — Z7984 Long term (current) use of oral hypoglycemic drugs: Secondary | ICD-10-CM | POA: Diagnosis not present

## 2020-09-15 DIAGNOSIS — Z961 Presence of intraocular lens: Secondary | ICD-10-CM | POA: Diagnosis not present

## 2020-09-15 DIAGNOSIS — H16223 Keratoconjunctivitis sicca, not specified as Sjogren's, bilateral: Secondary | ICD-10-CM | POA: Diagnosis not present

## 2020-10-13 DIAGNOSIS — E1129 Type 2 diabetes mellitus with other diabetic kidney complication: Secondary | ICD-10-CM | POA: Diagnosis not present

## 2020-10-13 DIAGNOSIS — Z1331 Encounter for screening for depression: Secondary | ICD-10-CM | POA: Diagnosis not present

## 2020-10-13 DIAGNOSIS — Z6836 Body mass index (BMI) 36.0-36.9, adult: Secondary | ICD-10-CM | POA: Diagnosis not present

## 2020-10-13 DIAGNOSIS — N951 Menopausal and female climacteric states: Secondary | ICD-10-CM | POA: Diagnosis not present

## 2020-10-13 DIAGNOSIS — Z0000 Encounter for general adult medical examination without abnormal findings: Secondary | ICD-10-CM | POA: Diagnosis not present

## 2020-10-13 DIAGNOSIS — E6609 Other obesity due to excess calories: Secondary | ICD-10-CM | POA: Diagnosis not present

## 2020-10-22 DIAGNOSIS — H26493 Other secondary cataract, bilateral: Secondary | ICD-10-CM | POA: Diagnosis not present

## 2020-10-25 ENCOUNTER — Encounter: Payer: Self-pay | Admitting: Gastroenterology

## 2020-10-25 NOTE — Progress Notes (Signed)
Referring Provider: Jake Samples, PA* Primary Care Physician:  Jake Samples, PA-C Primary GI Physician: Dr. Gala Romney  Chief Complaint  Patient presents with   Follow-up    HPI:   Maria Morrison is a 75 y.o. female with history of GERD, adenomatous colon polyps, alternating constipation and diarrhea, likely anal fissure and possible hemorrhoids July 2021 improved by [facility] hemorrhoid cream compounded with nitroglycerin.  She is presenting today for follow-up s/p surveillance colonoscopy.  Last seen via telemedicine visit 04/28/2020.  She reported bowels are moving well with limiting cheese.  Use MiraLAX sparingly.  No alarm symptoms.  GERD well controlled on omeprazole 20 mg daily.  She was using Alka-Seltzer as needed for breakthrough symptoms.  Recommended continuing current medications, use Tums or famotidine as needed for breakthrough GERD, and arrange colonoscopy.  Follow-up in 6 months.  Colonoscopy completed 06/16/2020 with entirely normal exam.  No recommendations to repeat due to age.   Today:  GERD remains well controlled on omeprazole 20 mg daily.  Denies abdominal pain, nausea, vomiting, dysphagia.  Bowels are moving well.  No longer requiring MiraLAX.  Denies BRBPR or melena.   Started hormone therapy for hot flashes which is helping. Estrogen and progesterone.    Past Medical History:  Diagnosis Date   Diabetes mellitus without complication ([facility])    GERD (gastroesophageal reflux disease)    HLD (hyperlipidemia)    HTN (hypertension)     Past Surgical History:  Procedure Laterality Date   APPENDECTOMY     BACK SURGERY     COLONOSCOPY N/A 02/25/2014   Procedure: COLONOSCOPY;  Surgeon: Daneil Dolin, MD;  two tubular adenomas, anal papilla, internal hemorrhoids.  Recommended repeat colonoscopy in 5 years.   COLONOSCOPY N/A 06/16/2020   Surgeon: Daneil Dolin, MD;  entirely normal exam.  No recommendations to repeat due to age.   TUBAL  LIGATION      Current Outpatient Medications  Medication Sig Dispense Refill   acetaminophen (TYLENOL) 500 MG tablet Take 1,000 mg by mouth every 6 (six) hours as needed for moderate pain (for back and knee pain).     atorvastatin (LIPITOR) 10 MG tablet Take 10 mg by mouth at bedtime.     lisinopril (PRINIVIL,ZESTRIL) 10 MG tablet Take 10 mg by mouth in the morning.     Melatonin 3 MG CAPS Take 3 mg by mouth at bedtime.     metFORMIN (GLUCOPHAGE) 500 MG tablet Take 500 mg by mouth at bedtime.     omeprazole (PRILOSEC OTC) 20 MG tablet Take 20 mg by mouth in the morning.     polyethylene glycol (MIRALAX / GLYCOLAX) packet Take 17 g by mouth daily as needed (constipation).     gabapentin (NEURONTIN) 100 MG capsule Take 1 capsule (100 mg total) by mouth 2 (two) times daily as needed for up to 10 days (Avoid driving and take as needed for pain). 20 capsule 0   No current facility-administered medications for this visit.    Allergies as of 10/27/2020 - Review Complete 10/27/2020  Allergen Reaction Noted   Codeine Nausea Only 02/11/2014    Family History  Problem Relation Age of Onset   Stroke Mother    Parkinson's disease Father    Stroke Maternal Grandfather    Stroke Paternal Grandfather    Stroke Paternal Grandmother    Colon cancer Neg Hx     Social History   Socioeconomic History   Marital status: Married  Spouse name: [name]   Number of children: 0   Years of education: 13   Highest education level: Not on file  Occupational History    Comment: retired  Tobacco Use   Smoking status: Former   Smokeless tobacco: Never  Substance and Sexual Activity   Alcohol use: No    Alcohol/week: 0.0 standard drinks   Drug use: No   Sexual activity: Not on file  Other Topics Concern   Not on file  Social History Narrative   Consumes 3-4 cans of soda daily   Social Determinants of Health   Financial Resource Strain: Not on file  Food Insecurity: Not on file   Transportation Needs: Not on file  Physical Activity: Not on file  Stress: Not on file  Social Connections: Not on file    Review of Systems: Gen: Denies fever, chills, cold or flulike symptoms, presyncope, syncope. CV: Denies chest pain, palpitations Resp: Denies dyspnea or cough. GI: See HPI Heme: See HPI  Physical Exam: BP 131/61   Pulse 75   Temp (!) 97 F (36.1 C) (Temporal)   Ht '5\' 3"'$  (1.6 m)   Wt 197 lb 12.8 oz (89.7 kg)   BMI 35.04 kg/m  General:   Alert and oriented. No distress noted. Pleasant and cooperative.  Head:  Normocephalic and atraumatic. Eyes:  Conjuctiva clear without scleral icterus. Heart:  S1, S2 present without murmurs appreciated. Lungs:  Clear to auscultation bilaterally. No wheezes, rales, or rhonchi. No distress.  Abdomen:  +BS, soft, non-tender and non-distended. No rebound or guarding. No HSM or masses noted. Msk:  Symmetrical without gross deformities. Normal posture. Extremities:  Without edema. Neurologic:  Alert and  oriented x4 Psych:  Normal mood and affect.    Assessment: 75 year old female with history of GERD, adenomatous colon polyps, and suspected anal fissure with possible hemorrhoids in July 2021 that resolved with [facility] hemorrhoid cream compounded with nitroglycerin.  She is presenting today for routine follow-up.  Colonoscopy recently updated April 2022 with entirely normal exam, no recommendations to repeat due to age.  Clinically, she is doing very well.  GERD remains well controlled on omeprazole 20 mg daily.  No other significant upper or lower GI symptoms.  No alarm symptoms.  Plan: 1.  Continue omeprazole 20 mg daily 30 minutes before breakfast. 2.  Reinforced GERD diet/lifestyle. 3.  Follow-up in 1 year or sooner if needed.    Aliene Altes, PA-C [facility] 10/27/2020

## 2020-10-27 ENCOUNTER — Other Ambulatory Visit: Payer: Self-pay

## 2020-10-27 ENCOUNTER — Encounter: Payer: Self-pay | Admitting: Gastroenterology

## 2020-10-27 ENCOUNTER — Ambulatory Visit (INDEPENDENT_AMBULATORY_CARE_PROVIDER_SITE_OTHER): Payer: Medicare Other | Admitting: Gastroenterology

## 2020-10-27 VITALS — BP 131/61 | HR 75 | Temp 97.0°F | Ht 63.0 in | Wt 197.8 lb

## 2020-10-27 DIAGNOSIS — K219 Gastro-esophageal reflux disease without esophagitis: Secondary | ICD-10-CM

## 2020-10-27 NOTE — Patient Instructions (Signed)
Continue omeprazole 20 mg daily 30 minutes before breakfast.  Follow a GERD diet:  Avoid fried, fatty, greasy, spicy, citrus foods. Avoid caffeine and carbonated beverages. Avoid chocolate. Try eating 4-6 small meals a day rather than 3 large meals. Do not eat within 3 hours of laying down. Prop head of bed up on wood or bricks to create a 6 inch incline.  We will plan to see back in 1 year.  Do not hesitate to call if you have any questions or concerns prior to her next visit.  It was great to see you today!  Aliene Altes, PA-C [facility]

## 2020-12-23 DIAGNOSIS — Z23 Encounter for immunization: Secondary | ICD-10-CM | POA: Diagnosis not present

## 2021-05-03 ENCOUNTER — Other Ambulatory Visit (HOSPITAL_COMMUNITY): Payer: Self-pay | Admitting: Physician Assistant

## 2021-05-03 DIAGNOSIS — Z1231 Encounter for screening mammogram for malignant neoplasm of breast: Secondary | ICD-10-CM

## 2021-05-11 ENCOUNTER — Other Ambulatory Visit: Payer: Self-pay

## 2021-05-11 ENCOUNTER — Ambulatory Visit (HOSPITAL_COMMUNITY)
Admission: RE | Admit: 2021-05-11 | Discharge: 2021-05-11 | Disposition: A | Discharge status: Home / Self Care | Payer: Medicare Other | Source: Ambulatory Visit | Attending: Physician Assistant | Admitting: Physician Assistant

## 2021-05-11 DIAGNOSIS — Z1231 Encounter for screening mammogram for malignant neoplasm of breast: Secondary | ICD-10-CM | POA: Diagnosis not present

## 2021-08-10 ENCOUNTER — Ambulatory Visit
Admission: EM | Admit: 2021-08-10 | Discharge: 2021-08-10 | Disposition: A | Discharge status: Home / Self Care | Payer: Medicare Other | Attending: Family Medicine | Admitting: Family Medicine

## 2021-08-10 ENCOUNTER — Encounter: Payer: Self-pay | Admitting: Emergency Medicine

## 2021-08-10 DIAGNOSIS — J069 Acute upper respiratory infection, unspecified: Secondary | ICD-10-CM

## 2021-08-10 MED ORDER — AZITHROMYCIN 250 MG PO TABS
ORAL_TABLET | ORAL | 0 refills | Status: DC
Start: 2021-08-10 — End: 2022-01-24

## 2021-08-10 MED ORDER — PREDNISONE 20 MG PO TABS: 40.0000 mg | ORAL_TABLET | Freq: Every day | ORAL | 0 refills | Status: DC

## 2021-08-10 MED ORDER — PROMETHAZINE-DM 6.25-15 MG/5ML PO SYRP: 5.0000 mL | ORAL_SOLUTION | Freq: Four times a day (QID) | ORAL | 0 refills | Status: DC | PRN

## 2021-08-10 NOTE — ED Triage Notes (Signed)
Scratchy throat on Thursday. Productive Cough started on Sunday.

## 2021-08-10 NOTE — Discharge Instructions (Signed)
Do not start the antibiotic unless worsening over the next 4-7 days. Continue mucinex, humidifiers, and other good supportive medications

## 2021-08-10 NOTE — ED Provider Notes (Signed)
[facility]    CSN: [id number] Arrival date & time: 08/10/21  0801      History   Chief Complaint No chief complaint on file.   HPI Maria Morrison is a 76 y.o. female.   Presenting today with about a week of sore throat, nasal congestion, hacking productive cough that has progressively worsened over the course of the week.  She does feel some chest tightness, wheezes at times.  Denies fever, chills, body aches, chest pain, abdominal pain, nausea vomiting or diarrhea.  Has been taking Mucinex cough syrup with minimal relief.  No known sick contacts recently.  No known history of chronic pulmonary disease.   Past Medical History:  Diagnosis Date   Diabetes mellitus without complication (HCC)    GERD (gastroesophageal reflux disease)    HLD (hyperlipidemia)    HTN (hypertension)     Patient Active Problem List   Diagnosis Date Noted   Gastroesophageal reflux disease 04/28/2020   Rectal bleeding 09/24/2019   Constipation 09/24/2019   Rectal pain 09/24/2019   History of colonic polyps     Past Surgical History:  Procedure Laterality Date   APPENDECTOMY     BACK SURGERY     COLONOSCOPY N/A 02/25/2014   Procedure: COLONOSCOPY;  Surgeon: Daneil Dolin, MD;  two tubular adenomas, anal papilla, internal hemorrhoids.  Recommended repeat colonoscopy in 5 years.   COLONOSCOPY N/A 06/16/2020   Surgeon: Daneil Dolin, MD;  entirely normal exam.  No recommendations to repeat due to age.   TUBAL LIGATION      OB History   No obstetric history on file.      Home Medications    Prior to Admission medications   Medication Sig Start Date End Date Taking? Authorizing Provider  azithromycin (ZITHROMAX) 250 MG tablet Take first 2 tablets together, then 1 every day until finished. Do not start unless worsening over the next 4-7 days 08/10/21  Yes Volney American, PA-C  predniSONE (DELTASONE) 20 MG tablet Take 2 tablets (40 mg total) by mouth daily with  breakfast. 08/10/21  Yes Volney American, PA-C  promethazine-dextromethorphan (PROMETHAZINE-DM) 6.25-15 MG/5ML syrup Take 5 mLs by mouth 4 (four) times daily as needed. 08/10/21  Yes Volney American, PA-C  acetaminophen (TYLENOL) 500 MG tablet Take 1,000 mg by mouth every 6 (six) hours as needed for moderate pain (for back and knee pain).    [provider]  atorvastatin (LIPITOR) 10 MG tablet Take 10 mg by mouth at bedtime.    [provider]  gabapentin (NEURONTIN) 100 MG capsule Take 1 capsule (100 mg total) by mouth 2 (two) times daily as needed for up to 10 days (Avoid driving and take as needed for pain). 06/20/20 06/30/20  Scot Jun, FNP  lisinopril (PRINIVIL,ZESTRIL) 10 MG tablet Take 10 mg by mouth in the morning.    [provider]  Melatonin 3 MG CAPS Take 3 mg by mouth at bedtime.    [provider]  metFORMIN (GLUCOPHAGE) 500 MG tablet Take 500 mg by mouth at bedtime.    [provider]  omeprazole (PRILOSEC OTC) 20 MG tablet Take 20 mg by mouth in the morning.    [provider]  polyethylene glycol (MIRALAX / GLYCOLAX) packet Take 17 g by mouth daily as needed (constipation).    [provider]    Family History Family History  Problem Relation Age of Onset   Stroke Mother    Parkinson's disease  Father    Stroke Maternal Grandfather    Stroke Paternal Grandfather    Stroke Paternal Grandmother    Colon cancer Neg Hx     Social History Social History   Tobacco Use   Smoking status: Former   Smokeless tobacco: Never  Substance Use Topics   Alcohol use: No    Alcohol/week: 0.0 standard drinks   Drug use: No     Allergies   Codeine   Review of Systems Review of Systems Per HPI  Physical Exam Triage Vital Signs ED Triage Vitals  Enc Vitals Group     BP 08/10/21 0807 136/75     Pulse Rate 08/10/21 0807 70     Resp 08/10/21 0807 18     Temp 08/10/21 0807 97.8 F (36.6 C)      Temp Source 08/10/21 0807 Oral     SpO2 08/10/21 0807 92 %     Weight --      Height --      Head Circumference --      Peak Flow --      Pain Score 08/10/21 0808 0     Pain Loc --      Pain Edu? --      Excl. in [place]? --    No data found.  Updated Vital Signs BP 136/75 (BP Location: Right Arm)   Pulse 70   Temp 97.8 F (36.6 C) (Oral)   Resp 18   SpO2 92%   Visual Acuity Right Eye Distance:   Left Eye Distance:   Bilateral Distance:    Right Eye Near:   Left Eye Near:    Bilateral Near:     Physical Exam Vitals and nursing note reviewed.  Constitutional:      Appearance: Normal appearance.  HENT:     Head: Atraumatic.     Right Ear: Tympanic membrane and external ear normal.     Left Ear: Tympanic membrane and external ear normal.     Nose: Congestion present.     Mouth/Throat:     Mouth: Mucous membranes are moist.     Pharynx: Posterior oropharyngeal erythema present.  Eyes:     Extraocular Movements: Extraocular movements intact.     Conjunctiva/sclera: Conjunctivae normal.  Cardiovascular:     Rate and Rhythm: Normal rate and regular rhythm.     Heart sounds: Normal heart sounds.  Pulmonary:     Effort: Pulmonary effort is normal.     Breath sounds: Normal breath sounds. No wheezing.  Musculoskeletal:        General: Normal range of motion.     Cervical back: Normal range of motion and neck supple.  Skin:    General: Skin is warm and dry.  Neurological:     Mental Status: She is alert and oriented to person, place, and time.  Psychiatric:        Mood and Affect: Mood normal.        Thought Content: Thought content normal.     UC Treatments / Results  Labs (all labs ordered are listed, but only abnormal results are displayed) Labs Reviewed - No data to display  EKG   Radiology No results found.  Procedures Procedures (including critical care time)  Medications Ordered in UC Medications - No data to display  Initial Impression /  Assessment and Plan / UC Course  I have reviewed the triage vital signs and the nursing notes.  Pertinent labs & imaging results that were available  during my care of the patient were reviewed by me and considered in my medical decision making (see chart for details).     Vital signs benign and reassuring, exam suspicious for bronchitis/lower respiratory infection.  We will treat with Phenergan DM, prednisone short course and azithromycin if not improving or worsening over the next few days.  Discussed continued over-the-counter supportive medications and home care.  Return for worsening symptoms.  Final Clinical Impressions(s) / UC Diagnoses   Final diagnoses:  Upper respiratory tract infection, unspecified type     Discharge Instructions      Do not start the antibiotic unless worsening over the next 4-7 days. Continue mucinex, humidifiers, and other good supportive medications    ED Prescriptions     Medication Sig Dispense Auth. Provider   predniSONE (DELTASONE) 20 MG tablet Take 2 tablets (40 mg total) by mouth daily with breakfast. 6 tablet Volney American, PA-C   promethazine-dextromethorphan (PROMETHAZINE-DM) 6.25-15 MG/5ML syrup Take 5 mLs by mouth 4 (four) times daily as needed. 100 mL Volney American, PA-C   azithromycin (ZITHROMAX) 250 MG tablet Take first 2 tablets together, then 1 every day until finished. Do not start unless worsening over the next 4-7 days 6 tablet Volney American, [place]      PDMP not reviewed this encounter.   Volney American, [place] 08/10/21 1343

## 2021-09-22 ENCOUNTER — Encounter: Payer: Self-pay | Admitting: Internal Medicine

## 2021-10-12 DIAGNOSIS — E119 Type 2 diabetes mellitus without complications: Secondary | ICD-10-CM | POA: Diagnosis not present

## 2021-10-12 DIAGNOSIS — E782 Mixed hyperlipidemia: Secondary | ICD-10-CM | POA: Diagnosis not present

## 2021-10-12 DIAGNOSIS — I10 Essential (primary) hypertension: Secondary | ICD-10-CM | POA: Diagnosis not present

## 2021-10-12 DIAGNOSIS — E1129 Type 2 diabetes mellitus with other diabetic kidney complication: Secondary | ICD-10-CM | POA: Diagnosis not present

## 2021-10-12 DIAGNOSIS — Z0001 Encounter for general adult medical examination with abnormal findings: Secondary | ICD-10-CM | POA: Diagnosis not present

## 2021-10-13 LAB — HEMOGLOBIN A1C: Hemoglobin A1C: 6

## 2021-10-13 LAB — TSH: TSH: 0.36 — AB (ref 0.41–5.90)

## 2021-11-28 DIAGNOSIS — R946 Abnormal results of thyroid function studies: Secondary | ICD-10-CM | POA: Diagnosis not present

## 2021-11-28 DIAGNOSIS — Z23 Encounter for immunization: Secondary | ICD-10-CM | POA: Diagnosis not present

## 2022-01-17 ENCOUNTER — Encounter: Payer: Self-pay | Admitting: Nurse Practitioner

## 2022-01-17 ENCOUNTER — Ambulatory Visit: Payer: Medicare Other | Admitting: Nurse Practitioner

## 2022-01-17 VITALS — BP 148/89 | HR 63 | Ht 62.0 in | Wt 197.0 lb

## 2022-01-17 DIAGNOSIS — R7989 Other specified abnormal findings of blood chemistry: Secondary | ICD-10-CM | POA: Diagnosis not present

## 2022-01-17 DIAGNOSIS — E0590 Thyrotoxicosis, unspecified without thyrotoxic crisis or storm: Secondary | ICD-10-CM | POA: Diagnosis not present

## 2022-01-17 NOTE — Progress Notes (Signed)
01/17/2022     Endocrinology Consult Note    Subjective:    Patient ID: Maria Morrison, female    DOB: Nov 15, 1945, PCP Jake Samples, PA-C.   Past Medical History:  Diagnosis Date   Diabetes mellitus without complication (HCC)    GERD (gastroesophageal reflux disease)    HLD (hyperlipidemia)    HTN (hypertension)     Past Surgical History:  Procedure Laterality Date   APPENDECTOMY     BACK SURGERY     COLONOSCOPY N/A 02/25/2014   Procedure: COLONOSCOPY;  Surgeon: Daneil Dolin, MD;  two tubular adenomas, anal papilla, internal hemorrhoids.  Recommended repeat colonoscopy in 5 years.   COLONOSCOPY N/A 06/16/2020   Surgeon: Daneil Dolin, MD;  entirely normal exam.  No recommendations to repeat due to age.   TUBAL LIGATION      Social History   Socioeconomic History   Marital status: Married    Spouse name: [name]   Number of children: 0   Years of education: 13   Highest education level: Not on file  Occupational History    Comment: retired  Tobacco Use   Smoking status: Former   Smokeless tobacco: Never  Substance and Sexual Activity   Alcohol use: No    Alcohol/week: 0.0 standard drinks of alcohol   Drug use: No   Sexual activity: Not on file  Other Topics Concern   Not on file  Social History Narrative   Consumes 3-4 cans of soda daily   Social Determinants of Health   Financial Resource Strain: Not on file  Food Insecurity: Not on file  Transportation Needs: Not on file  Physical Activity: Not on file  Stress: Not on file  Social Connections: Not on file    Family History  Problem Relation Age of Onset   Hypertension Mother    Stroke Mother    Hypertension Father    Diabetes Father    Parkinson's disease Father    Stroke Maternal Grandfather    Stroke Paternal Grandmother    Stroke Paternal Grandfather    Colon cancer Neg Hx     Outpatient Encounter Medications as of 01/17/2022  Medication Sig   acetaminophen  (TYLENOL) 500 MG tablet Take 1,000 mg by mouth every 6 (six) hours as needed for moderate pain (for back and knee pain).   atorvastatin (LIPITOR) 10 MG tablet Take 10 mg by mouth at bedtime.   estradiol (VIVELLE-DOT) 0.0375 MG/24HR Place 1 patch onto the skin 2 (two) times a week. Patient states that she applies a patch on Monday - and Friday   lisinopril (PRINIVIL,ZESTRIL) 10 MG tablet Take 10 mg by mouth in the morning.   Melatonin 3 MG CAPS Take 3 mg by mouth at bedtime.   metFORMIN (GLUCOPHAGE) 500 MG tablet Take 500 mg by mouth at bedtime.   omeprazole (PRILOSEC OTC) 20 MG tablet Take 20 mg by mouth in the morning.   polyethylene glycol (MIRALAX / GLYCOLAX) packet Take 17 g by mouth daily as needed (constipation).   progesterone (PROMETRIUM) 100 MG capsule Take 100 mg by mouth at bedtime.   azithromycin (ZITHROMAX) 250 MG tablet Take first 2 tablets together, then 1 every day until finished. Do not start unless worsening over the next 4-7 days (Patient not taking: Reported on 01/17/2022)   gabapentin (NEURONTIN) 100 MG capsule Take 1 capsule (100 mg total) by mouth 2 (two) times daily as needed for up to 10 days (Avoid driving and  take as needed for pain).   predniSONE (DELTASONE) 20 MG tablet Take 2 tablets (40 mg total) by mouth daily with breakfast. (Patient not taking: Reported on 01/17/2022)   promethazine-dextromethorphan (PROMETHAZINE-DM) 6.25-15 MG/5ML syrup Take 5 mLs by mouth 4 (four) times daily as needed. (Patient not taking: Reported on 01/17/2022)   No facility-administered encounter medications on file as of 01/17/2022.    ALLERGIES: Allergies  Allergen Reactions   Codeine Nausea Only    VACCINATION STATUS: Immunization History  Administered Date(s) Administered   Influenza-Unspecified 12/05/2014, 12/04/2017     HPI  Maria Morrison is 76 y.o. female who presents today with a medical history as above. she is being seen in consultation for hyperthyroidism requested  by Jake Samples, PA-C.  she has been dealing with symptoms of loose bowels, hot flashes, and tremors intermittently for 10-15 years. These symptoms are progressively worsening and troubling to her.  her most recent thyroid labs revealed marginally suppressed TSH of 0.359 on 10/13/21.  she denies dysphagia, choking, shortness of breath, no recent voice change.    she denies known family history of thyroid dysfunction and denies family hx of thyroid cancer. she denies personal history of goiter. she is not on any anti-thyroid medications nor on any thyroid hormone supplements. Denies use of Biotin containing supplements.  she is willing to proceed with appropriate work up and therapy for thyrotoxicosis.  She has never had any imaging of her thyroid in the past, nor been exposed to radiation.  She mentions her PCP thinks her symptoms and suppression of her TSH could be contributed by her Estradiol and Progesterone use (prescribed to control hot flashes).   Review of systems  Constitutional: + Minimally fluctuating body weight, current Body mass index is 36.03 kg/m., no fatigue, + subjective hyperthermia-intermittent, no subjective hypothermia Eyes: no blurry vision, no xerophthalmia ENT: no sore throat, no nodules palpated in throat, no dysphagia/odynophagia, no hoarseness Cardiovascular: no chest pain, no shortness of breath, no palpitations, no leg swelling Respiratory: no cough, no shortness of breath Gastrointestinal: no nausea/vomiting/diarrhea, chronic loose bowels Musculoskeletal: no muscle/joint aches Skin: no rashes, no hyperemia Neurological: + intermittent tremors, no numbness, no tingling, no dizziness Psychiatric: no depression, no anxiety   Objective:    BP (!) 148/89 (BP Location: Left Arm, Patient Position: Sitting, Cuff Size: Normal)   Pulse 63   Ht '5\' 2"'$  (1.575 m)   Wt 197 lb (89.4 kg)   BMI 36.03 kg/m   Wt Readings from Last 3 Encounters:  01/17/22 197 lb (89.4  kg)  10/27/20 197 lb 12.8 oz (89.7 kg)  06/16/20 190 lb (86.2 kg)     BP Readings from Last 3 Encounters:  01/17/22 (!) 148/89  08/10/21 136/75  10/27/20 131/61                          Physical Exam- Limited  Constitutional:  Body mass index is 36.03 kg/m. , not in acute distress, normal state of mind Eyes:  EOMI, no exophthalmos Neck: Supple Thyroid: No gross goiter, R>L Cardiovascular: RRR, no murmurs, rubs, or gallops, no edema Respiratory: Adequate breathing efforts, no crackles, rales, rhonchi, or wheezing Musculoskeletal: no gross deformities, strength intact in all four extremities, no gross restriction of joint movements Skin:  no rashes, no hyperemia Neurological: + slight tremor with outstretched hands, DTR normal in BLE   CMP  No results found for: "NA", "K", "CL", "CO2", "GLUCOSE", "BUN", "CREATININE", "CALCIUM", "PROT", "ALBUMIN", "  AST", "ALT", "ALKPHOS", "BILITOT", "GFRNONAA", "GFRAA"   CBC No results found for: "WBC", "RBC", "HGB", "HCT", "PLT", "MCV", "MCH", "MCHC", "RDW", "LYMPHSABS", "MONOABS", "EOSABS", "BASOSABS"   Diabetic Labs (most recent): Lab Results  Component Value Date   HGBA1C 6 10/13/2021    Lipid Panel  No results found for: "CHOL", "TRIG", "HDL", "CHOLHDL", "VLDL", "LDLCALC", "LDLDIRECT", "LABVLDL"   Lab Results  Component Value Date   TSH 0.36 (A) 10/13/2021        Assessment & Plan:   1. Abnormal TSH 2. Hyperthyroidism  she is being seen at a kind request of Jake Samples, [place].  her history and most recent labs are reviewed, and she was examined clinically. Subjective and objective findings are inconsistent with thyrotoxicosis from primary hyperthyroidism. However, the potential risks of untreated thyrotoxicosis and the need for definitive therapy have been discussed in detail with her, and she agrees to proceed with diagnostic workup and treatment plan.   I will repeat full profile thyroid function tests today  (including antibody testing to assess for autoimmune thyroid dysfunction).  Will hold off on imaging studies at this time until we get repeat labs back.  She could possibly benefit from ultrasound to assess thyroid anatomy, since R lobe is bigger than left during exam today.     she will return in 1 week to discuss lab findings and to develop treatment plan.   I did not initiate any new prescriptions at today's visit.    -Patient is advised to maintain close follow up with Jake Samples, PA-C for primary care needs.   - Time spent with the patient: 45 minutes, of which >50% was spent in obtaining information about her symptoms, reviewing her previous labs, evaluations, and treatments, counseling her about her hyperthyroidism , and developing a plan to confirm the diagnosis and long term treatment as necessary. Please refer to "Patient Self Inventory" in the Media tab for reviewed elements of pertinent patient history.  Maria Morrison participated in the discussions, expressed understanding, and voiced agreement with the above plans.  All questions were answered to her satisfaction. she is encouraged to contact clinic should she have any questions or concerns prior to her return visit.   Follow up plan: Return in about 1 week (around 01/24/2022) for Thyroid follow up, Previsit labs.   Thank you for involving me in the care of this pleasant patient, and I will continue to update you with her progress.    Rayetta Pigg, [facility] [address] [place],  [address] Phone: [phone] Fax: [phone]  01/17/2022, 9:58 AM

## 2022-01-17 NOTE — Patient Instructions (Signed)
Thyroid-Stimulating Hormone Test Why am I having this test? The thyroid is a gland in the lower front of the neck. It makes hormones that affect many body parts and systems, including the system that affects how quickly the body burns fuel for energy (metabolism). The pituitary gland is located just below the brain, behind the eyes and nasal passages. It helps maintain thyroid hormone levels and thyroid gland function. You may have a thyroid-stimulating hormone (TSH) test if you have possible symptoms of abnormal thyroid hormone levels. This test can help your health care provider: Diagnose a disorder of the thyroid gland or pituitary gland. Manage your condition and treatment if you have an underactive thyroid (hypothyroidism) or an overactive thyroid (hyperthyroidism). Newborn babies may have this test done to screen for hypothyroidism that is present at birth (congenital). What is being tested? This test measures the amount of TSH in your blood. TSH may also be called thyrotropin. When the thyroid does not make enough hormones, the pituitary gland releases TSH into the bloodstream to stimulate the thyroid gland to make more hormones. What kind of sample is taken?     A blood sample is required for this test. It is usually collected by inserting a needle into a blood vessel. For newborns, a small amount of blood may be collected from the umbilical cord, or by using a small needle to prick the baby's heel (heel stick). Tell a health care provider about: All medicines you are taking, including vitamins, herbs, eye drops, creams, and over-the-counter medicines. Any bleeding problems you have. Any surgeries you have had. Any medical conditions you have. Whether you are pregnant or may be pregnant. How are the results reported? Your test results will be reported as a value that indicates how much TSH is in your blood. Your health care provider will compare your results to normal ranges that were  established after testing a large group of people (reference ranges). Reference ranges may vary among labs and hospitals. For this test, common reference ranges are: Adult: 2-10 microunits/mL or 2-10 milliunits/L. Newborn: Heel stick: 3-18 microunits/mL or 3-18 milliunits/L. Umbilical cord: 3-12 microunits/mL or 3-12 milliunits/L. What do the results mean? Results that are within the reference range are considered normal. This means that you have a normal amount of TSH in your blood. Results that are higher than the reference range mean that your TSH levels are too high. This may mean: Your thyroid gland is not making enough thyroid hormones. Your thyroid medicine dosage is too low. You have a tumor on your pituitary gland. This is rare. Results that are lower than the reference range mean that your TSH levels are too low. This may be caused by hyperthyroidism or by a problem with the pituitary gland function. Talk with your health care provider about what your results mean. Questions to ask your health care provider Ask your health care provider, or the department that is doing the test: When will my results be ready? How will I get my results? What are my treatment options? What other tests do I need? What are my next steps? Summary You may have a thyroid-stimulating hormone (TSH) test if you have possible symptoms of abnormal thyroid hormone levels. The thyroid is a gland in the lower front of the neck. It makes hormones that affect many body parts and systems. The pituitary gland is located just below the brain, behind the eyes and nasal passages. It helps maintain thyroid hormone levels and thyroid gland function. This test   measures the amount of TSH in your blood. TSH is made by the pituitary gland. It may also be called thyrotropin. This information is not intended to replace advice given to you by your health care provider. Make sure you discuss any questions you have with your  health care provider. Document Revised: 02/22/2021 Document Reviewed: 02/22/2021 Elsevier Patient Education  2023 [employer]  

## 2022-01-18 LAB — THYROID PEROXIDASE ANTIBODY: Thyroperoxidase Ab SerPl-aCnc: <9 IU/mL (ref 0–34)

## 2022-01-18 LAB — T4, FREE: Free T4: 0.99 ng/dL (ref 0.82–1.77)

## 2022-01-18 LAB — TSH: TSH: 0.43 u[IU]/mL — ABNORMAL LOW (ref 0.450–4.500)

## 2022-01-18 LAB — T3, FREE: T3, Free: 3.2 pg/mL (ref 2.0–4.4)

## 2022-01-18 LAB — THYROGLOBULIN ANTIBODY: Thyroglobulin Antibody: <1 IU/mL (ref 0.0–0.9)

## 2022-01-24 ENCOUNTER — Encounter: Payer: Self-pay | Admitting: Nurse Practitioner

## 2022-01-24 ENCOUNTER — Ambulatory Visit: Payer: Medicare Other | Admitting: Nurse Practitioner

## 2022-01-24 VITALS — BP 119/76 | HR 67 | Ht 62.0 in | Wt 196.4 lb

## 2022-01-24 DIAGNOSIS — R7989 Other specified abnormal findings of blood chemistry: Secondary | ICD-10-CM

## 2022-01-24 NOTE — Progress Notes (Signed)
01/24/2022     Endocrinology Follow Up Note    Subjective:    Patient ID: Maria Morrison, female    DOB: [birth year], PCP Jake Samples, PA-C.   Past Medical History:  Diagnosis Date   Diabetes mellitus without complication (HCC)    GERD (gastroesophageal reflux disease)    HLD (hyperlipidemia)    HTN (hypertension)     Past Surgical History:  Procedure Laterality Date   APPENDECTOMY     BACK SURGERY     COLONOSCOPY N/A 02/25/2014   Procedure: COLONOSCOPY;  Surgeon: Daneil Dolin, MD;  two tubular adenomas, anal papilla, internal hemorrhoids.  Recommended repeat colonoscopy in 5 years.   COLONOSCOPY N/A 06/16/2020   Surgeon: Daneil Dolin, MD;  entirely normal exam.  No recommendations to repeat due to age.   TUBAL LIGATION      Social History   Socioeconomic History   Marital status: Married    Spouse name: [name]   Number of children: 0   Years of education: 13   Highest education level: Not on file  Occupational History    Comment: retired  Tobacco Use   Smoking status: Former   Smokeless tobacco: Never  Substance and Sexual Activity   Alcohol use: No    Alcohol/week: 0.0 standard drinks of alcohol   Drug use: No   Sexual activity: Not on file  Other Topics Concern   Not on file  Social History Narrative   Consumes 3-4 cans of soda daily   Social Determinants of Health   Financial Resource Strain: Not on file  Food Insecurity: Not on file  Transportation Needs: Not on file  Physical Activity: Not on file  Stress: Not on file  Social Connections: Not on file    Family History  Problem Relation Age of Onset   Hypertension Mother    Stroke Mother    Hypertension Father    Diabetes Father    Parkinson's disease Father    Stroke Maternal Grandfather    Stroke Paternal Grandmother    Stroke Paternal Grandfather    Colon cancer Neg Hx     Outpatient Encounter Medications as of 01/24/2022  Medication Sig   acetaminophen  (TYLENOL) 500 MG tablet Take 1,000 mg by mouth every 6 (six) hours as needed for moderate pain (for back and knee pain).   atorvastatin (LIPITOR) 10 MG tablet Take 10 mg by mouth at bedtime.   estradiol (VIVELLE-DOT) 0.0375 MG/24HR Place 1 patch onto the skin 2 (two) times a week. Patient states that she applies a patch on Monday - and Friday   lisinopril (PRINIVIL,ZESTRIL) 10 MG tablet Take 10 mg by mouth in the morning.   Melatonin 3 MG CAPS Take 3 mg by mouth at bedtime.   metFORMIN (GLUCOPHAGE) 500 MG tablet Take 500 mg by mouth at bedtime.   omeprazole (PRILOSEC OTC) 20 MG tablet Take 20 mg by mouth in the morning.   polyethylene glycol (MIRALAX / GLYCOLAX) packet Take 17 g by mouth daily as needed (constipation).   progesterone (PROMETRIUM) 100 MG capsule Take 100 mg by mouth at bedtime.   gabapentin (NEURONTIN) 100 MG capsule Take 1 capsule (100 mg total) by mouth 2 (two) times daily as needed for up to 10 days (Avoid driving and take as needed for pain).   [DISCONTINUED] azithromycin (ZITHROMAX) 250 MG tablet Take first 2 tablets together, then 1 every day until finished. Do not start unless worsening over the next 4-7  days (Patient not taking: Reported on 01/17/2022)   [DISCONTINUED] predniSONE (DELTASONE) 20 MG tablet Take 2 tablets (40 mg total) by mouth daily with breakfast. (Patient not taking: Reported on 01/17/2022)   [DISCONTINUED] promethazine-dextromethorphan (PROMETHAZINE-DM) 6.25-15 MG/5ML syrup Take 5 mLs by mouth 4 (four) times daily as needed. (Patient not taking: Reported on 01/17/2022)   No facility-administered encounter medications on file as of 01/24/2022.    ALLERGIES: Allergies  Allergen Reactions   Codeine Nausea Only    VACCINATION STATUS: Immunization History  Administered Date(s) Administered   Influenza-Unspecified 12/05/2014, 12/04/2017     HPI  Maria Morrison is 76 y.o. female who presents today with a medical history as above. she is being seen  in follow up after being seen in consultation for hyperthyroidism requested by Jake Samples, PA-C.  she has been dealing with symptoms of loose bowels, hot flashes, and tremors intermittently for 10-15 years. These symptoms are progressively worsening and troubling to her.  her most recent thyroid labs revealed marginally suppressed TSH of 0.359 on 10/13/21.  she denies dysphagia, choking, shortness of breath, no recent voice change.    she denies known family history of thyroid dysfunction and denies family hx of thyroid cancer. she denies personal history of goiter. she is not on any anti-thyroid medications nor on any thyroid hormone supplements. Denies use of Biotin containing supplements.  she is willing to proceed with appropriate work up and therapy for thyrotoxicosis.  She has never had any imaging of her thyroid in the past, nor been exposed to radiation.  She mentions her PCP thinks her symptoms and suppression of her TSH could be contributed by her Estradiol and Progesterone use (prescribed to control hot flashes).   Review of systems  Constitutional: + Minimally fluctuating body weight, current Body mass index is 35.92 kg/m., no fatigue, + subjective hyperthermia-intermittent, no subjective hypothermia Eyes: no blurry vision, no xerophthalmia ENT: no sore throat, no nodules palpated in throat, no dysphagia/odynophagia, no hoarseness Cardiovascular: no chest pain, no shortness of breath, no palpitations, no leg swelling Respiratory: no cough, no shortness of breath Gastrointestinal: no nausea/vomiting/diarrhea, chronic loose bowels Musculoskeletal: no muscle/joint aches Skin: no rashes, no hyperemia Neurological: + intermittent tremors-stable, no numbness, no tingling, no dizziness Psychiatric: no depression, no anxiety   Objective:    BP 119/76 (BP Location: Left Arm, Patient Position: Sitting, Cuff Size: Large)   Pulse 67   Ht '5\' 2"'$  (1.575 m)   Wt 196 lb 6.4 oz (89.1  kg)   BMI 35.92 kg/m   Wt Readings from Last 3 Encounters:  01/24/22 196 lb 6.4 oz (89.1 kg)  01/17/22 197 lb (89.4 kg)  10/27/20 197 lb 12.8 oz (89.7 kg)     BP Readings from Last 3 Encounters:  01/24/22 119/76  01/17/22 (!) 148/89  08/10/21 136/75    Physical Exam- Limited  Constitutional:  Body mass index is 35.92 kg/m. , not in acute distress, normal state of mind Eyes:  EOMI, no exophthalmos Neck: Supple Thyroid: No gross goiter, R>L Cardiovascular: RRR, no murmurs, rubs, or gallops, no edema Respiratory: Adequate breathing efforts, no crackles, rales, rhonchi, or wheezing Musculoskeletal: no gross deformities, strength intact in all four extremities, no gross restriction of joint movements Skin:  no rashes, no hyperemia Neurological: + slight tremor with outstretched hands, DTR normal in BLE   CMP  No results found for: "NA", "K", "CL", "CO2", "GLUCOSE", "BUN", "CREATININE", "CALCIUM", "PROT", "ALBUMIN", "AST", "ALT", "ALKPHOS", "BILITOT", "GFRNONAA", "GFRAA"   CBC  No results found for: "WBC", "RBC", "HGB", "HCT", "PLT", "MCV", "MCH", "MCHC", "RDW", "LYMPHSABS", "MONOABS", "EOSABS", "BASOSABS"   Diabetic Labs (most recent): Lab Results  Component Value Date   HGBA1C 6 10/13/2021    Lipid Panel  No results found for: "CHOL", "TRIG", "HDL", "CHOLHDL", "VLDL", "LDLCALC", "LDLDIRECT", "LABVLDL"   Lab Results  Component Value Date   TSH 0.430 (L) 01/17/2022   TSH 0.36 (A) 10/13/2021   FREET4 0.99 01/17/2022      Latest Reference Range & Units 10/13/21 00:00 01/17/22 10:11  TSH 0.450 - 4.500 uIU/mL 0.36 ! (E) 0.430 (L)  Triiodothyronine,Free,Serum 2.0 - 4.4 pg/mL  3.2  T4,Free(Direct) 0.82 - 1.77 ng/dL  0.99  Thyroperoxidase Ab SerPl-aCnc 0 - 34 IU/mL  <9  Thyroglobulin Antibody 0.0 - 0.9 IU/mL  <1.0  !: Data is abnormal (L): Data is abnormally low (E): External lab result   Assessment & Plan:   1. Abnormal TSH 2. Subclinical Hyperthyroidism  she  is being seen at a kind request of Jake Samples, PA-C.  Her repeat thyroid function tests show slightly suppressed TSH of 0.43, improving from labs from August.  Her Free T4 and Free T3 were normal and her antibody testing was negative, ruling out autoimmune thyroid disease.  She does not need any antithyroid treatment or thyroid hormone replacement at this time.  We will need to repeat her thyroid labs again in 3 months for surveillance.   She could possibly benefit from ultrasound to assess thyroid anatomy, since R lobe is bigger than left during exam today.     -Patient is advised to maintain close follow up with Jake Samples, PA-C for primary care needs.   I spent 20 minutes in the care of the patient today including review of labs from Thyroid Function, CMP, and other relevant labs ; imaging/biopsy records (current and previous including abstractions from other facilities); face-to-face time discussing  her lab results and symptoms, medications doses, her options of short and long term treatment based on the latest standards of care / guidelines;   and documenting the encounter.  Maria Morrison  participated in the discussions, expressed understanding, and voiced agreement with the above plans.  All questions were answered to her satisfaction. she is encouraged to contact clinic should she have any questions or concerns prior to her return visit.  Follow up plan: Return in about 3 months (around 04/26/2022) for Thyroid follow up, Previsit labs.   Thank you for involving me in the care of this pleasant patient, and I will continue to update you with her progress.    Rayetta Pigg, [facility] [address] [place], [place] [address] Phone: [phone] Fax: [phone]  01/24/2022, 10:32 AM

## 2022-04-06 ENCOUNTER — Other Ambulatory Visit (HOSPITAL_COMMUNITY): Payer: Self-pay | Admitting: Family Medicine

## 2022-04-06 DIAGNOSIS — Z1231 Encounter for screening mammogram for malignant neoplasm of breast: Secondary | ICD-10-CM

## 2022-04-06 HISTORY — PX: INJECTION KNEE: SHX2446

## 2022-04-10 ENCOUNTER — Ambulatory Visit (INDEPENDENT_AMBULATORY_CARE_PROVIDER_SITE_OTHER): Payer: Medicare Other

## 2022-04-10 ENCOUNTER — Encounter: Payer: Self-pay | Admitting: Orthopedic Surgery

## 2022-04-10 ENCOUNTER — Ambulatory Visit (INDEPENDENT_AMBULATORY_CARE_PROVIDER_SITE_OTHER): Payer: Medicare Other | Admitting: Orthopedic Surgery

## 2022-04-10 VITALS — BP 169/76 | HR 79 | Ht 63.0 in | Wt 200.2 lb

## 2022-04-10 DIAGNOSIS — M170 Bilateral primary osteoarthritis of knee: Secondary | ICD-10-CM

## 2022-04-10 DIAGNOSIS — G8929 Other chronic pain: Secondary | ICD-10-CM

## 2022-04-10 NOTE — Patient Instructions (Signed)

## 2022-04-11 NOTE — Progress Notes (Signed)
New Patient Visit  Assessment: Maria Morrison is a 77 y.o. female with the following: 1. Primary osteoarthritis of both knees  Plan: [name] has pain in both knees, right is worse than the left.  Radiographs demonstrate advanced degenerative changes, with varus alignment, and loss of joint space within the medial compartment.  We discussed multiple treatment options.  She is interested in proceeding with an injection in her right knee.  Pending the efficacy, she may return for an injection of the left knee.  Injection was completed in clinic today without issues.  She will return to clinic as needed.  Procedure note injection Right knee joint   Verbal consent was obtained to inject the right knee joint  Timeout was completed to confirm the site of injection.  The skin was prepped with alcohol and ethyl chloride was sprayed at the injection site.  A 21-gauge needle was used to inject 40 mg of Depo-Medrol and 1% lidocaine (3 cc) into the right knee using an anterolateral approach.  There were no complications. A sterile bandage was applied.   Follow-up: Return if symptoms worsen or fail to improve.  Subjective:  Chief Complaint  Patient presents with   New Patient (Initial Visit)    Bilateral knee pain RT>LT No previous treatment   Injections    RT knee NDC: [address]-2 LOT: [id number] EXP:03/2023    History of Present Illness: Maria Morrison is a 77 y.o. female who presents for evaluation of bilateral knee pain.  She has had progressively worsening knee pain bilaterally for several years.  No specific injury.  Right is more painful than the left.  She notes pain within the medial aspect of both knees, and some pain behind her kneecap.  She has been taking over-the-counter pain medications with limited improvement in her symptoms.  She has not had an injection.  She has not worked with physical therapy.  Occasionally, she will ambulate with a cane, and notes that this  helps her mobility.  Review of Systems: No fevers or chills No numbness or tingling No chest pain No shortness of breath No bowel or bladder dysfunction No GI distress No headaches   Medical History:  Past Medical History:  Diagnosis Date   Diabetes mellitus without complication (HCC)    GERD (gastroesophageal reflux disease)    HLD (hyperlipidemia)    HTN (hypertension)     Past Surgical History:  Procedure Laterality Date   APPENDECTOMY     BACK SURGERY     COLONOSCOPY N/A 02/25/2014   Procedure: COLONOSCOPY;  Surgeon: Daneil Dolin, MD;  two tubular adenomas, anal papilla, internal hemorrhoids.  Recommended repeat colonoscopy in 5 years.   COLONOSCOPY N/A 06/16/2020   Surgeon: Daneil Dolin, MD;  entirely normal exam.  No recommendations to repeat due to age.   TUBAL LIGATION      Family History  Problem Relation Age of Onset   Hypertension Mother    Stroke Mother    Hypertension Father    Diabetes Father    Parkinson's disease Father    Stroke Maternal Grandfather    Stroke Paternal Grandmother    Stroke Paternal Grandfather    Colon cancer Neg Hx    Social History   Tobacco Use   Smoking status: Former   Smokeless tobacco: Never  Substance Use Topics   Alcohol use: No    Alcohol/week: 0.0 standard drinks of alcohol   Drug use: No    Allergies  Allergen Reactions  Codeine Nausea Only    Current Meds  Medication Sig   acetaminophen (TYLENOL) 500 MG tablet Take 1,000 mg by mouth every 6 (six) hours as needed for moderate pain (for back and knee pain).   atorvastatin (LIPITOR) 10 MG tablet Take 10 mg by mouth at bedtime.   gabapentin (NEURONTIN) 100 MG capsule Take 1 capsule (100 mg total) by mouth 2 (two) times daily as needed for up to 10 days (Avoid driving and take as needed for pain).   lisinopril (PRINIVIL,ZESTRIL) 10 MG tablet Take 10 mg by mouth in the morning.   Melatonin 3 MG CAPS Take 3 mg by mouth at bedtime.   metFORMIN  (GLUCOPHAGE) 500 MG tablet Take 500 mg by mouth at bedtime.   omeprazole (PRILOSEC OTC) 20 MG tablet Take 20 mg by mouth in the morning.   polyethylene glycol (MIRALAX / GLYCOLAX) packet Take 17 g by mouth daily as needed (constipation).    Objective: BP (!) 169/76   Pulse 79   Ht '5\' 3"'$  (1.6 m)   Wt 200 lb 3.2 oz (90.8 kg)   BMI 35.46 kg/m   Physical Exam:  General: Alert and oriented. and No acute distress. Gait: Slow, steady gait.  Bilateral knees without effusion.  No bruising.  No swelling.  Good range of motion bilaterally.  Tenderness to palpation over the medial joint line.  No increased laxity to varus or valgus stress.  Varus alignment overall.  IMAGING: I personally ordered and reviewed the following images  X-rays of both knees were obtained in clinic today.  No acute injuries are noted.  Varus alignment overall.  Near complete loss of joint space within the medial compartment.  There are associated osteophytes.  There are osteophytes in the patellofemoral compartment as well.  No bony lesions.  Impression: Bilateral knee x-rays with advanced degenerative changes.   New Medications:  No orders of the defined types were placed in this encounter.     Mordecai Rasmussen, MD  04/11/2022 8:02 AM

## 2022-04-18 DIAGNOSIS — R7989 Other specified abnormal findings of blood chemistry: Secondary | ICD-10-CM | POA: Diagnosis not present

## 2022-04-19 DIAGNOSIS — E119 Type 2 diabetes mellitus without complications: Secondary | ICD-10-CM | POA: Diagnosis not present

## 2022-04-19 DIAGNOSIS — E1129 Type 2 diabetes mellitus with other diabetic kidney complication: Secondary | ICD-10-CM | POA: Diagnosis not present

## 2022-04-19 DIAGNOSIS — E782 Mixed hyperlipidemia: Secondary | ICD-10-CM | POA: Diagnosis not present

## 2022-04-19 DIAGNOSIS — I10 Essential (primary) hypertension: Secondary | ICD-10-CM | POA: Diagnosis not present

## 2022-04-19 LAB — T4, FREE: Free T4: 1.04 ng/dL (ref 0.82–1.77)

## 2022-04-19 LAB — T3, FREE: T3, Free: 3.3 pg/mL (ref 2.0–4.4)

## 2022-04-19 LAB — TSH: TSH: 0.644 u[IU]/mL (ref 0.450–4.500)

## 2022-04-26 ENCOUNTER — Ambulatory Visit: Payer: Medicare Other | Admitting: Nurse Practitioner

## 2022-04-26 ENCOUNTER — Encounter: Payer: Self-pay | Admitting: Nurse Practitioner

## 2022-04-26 VITALS — BP 138/72 | HR 60 | Ht 63.0 in | Wt 198.2 lb

## 2022-04-26 DIAGNOSIS — E0590 Thyrotoxicosis, unspecified without thyrotoxic crisis or storm: Secondary | ICD-10-CM | POA: Diagnosis not present

## 2022-04-26 DIAGNOSIS — R7989 Other specified abnormal findings of blood chemistry: Secondary | ICD-10-CM

## 2022-04-26 NOTE — Progress Notes (Signed)
04/26/2022     Endocrinology Follow Up Note    Subjective:    Patient ID: Maria Morrison, female    DOB: 05-Aug-1945, PCP Maria Samples, PA-C.   Past Medical History:  Diagnosis Date   Diabetes mellitus without complication (HCC)    GERD (gastroesophageal reflux disease)    HLD (hyperlipidemia)    HTN (hypertension)     Past Surgical History:  Procedure Laterality Date   APPENDECTOMY     BACK SURGERY     COLONOSCOPY N/A 02/25/2014   Procedure: COLONOSCOPY;  Surgeon: Daneil Dolin, MD;  two tubular adenomas, anal papilla, internal hemorrhoids.  Recommended repeat colonoscopy in 5 years.   COLONOSCOPY N/A 06/16/2020   Surgeon: Daneil Dolin, MD;  entirely normal exam.  No recommendations to repeat due to age.   INJECTION KNEE Right 04/2022   TUBAL LIGATION      Social History   Socioeconomic History   Marital status: Married    Spouse name: Maria Morrison   Number of children: 0   Years of education: 13   Highest education level: Not on file  Occupational History    Comment: retired  Tobacco Use   Smoking status: Former   Smokeless tobacco: Never  Substance and Sexual Activity   Alcohol use: No    Alcohol/week: 0.0 standard drinks of alcohol   Drug use: No   Sexual activity: Not on file  Other Topics Concern   Not on file  Social History Narrative   Consumes 3-4 cans of soda daily   Social Determinants of Health   Financial Resource Strain: Not on file  Food Insecurity: Not on file  Transportation Needs: Not on file  Physical Activity: Not on file  Stress: Not on file  Social Connections: Not on file    Family History  Problem Relation Age of Onset   Hypertension Mother    Stroke Mother    Hypertension Father    Diabetes Father    Parkinson's disease Father    Stroke Maternal Grandfather    Stroke Paternal Grandmother    Stroke Paternal Grandfather    Colon cancer Neg Hx     Outpatient Encounter Medications as of 04/26/2022   Medication Sig   acetaminophen (TYLENOL) 500 MG tablet Take 1,000 mg by mouth every 6 (six) hours as needed for moderate pain (for back and knee pain).   atorvastatin (LIPITOR) 10 MG tablet Take 10 mg by mouth at bedtime.   lisinopril (PRINIVIL,ZESTRIL) 10 MG tablet Take 10 mg by mouth in the morning.   Melatonin 3 MG CAPS Take 3 mg by mouth at bedtime.   metFORMIN (GLUCOPHAGE) 500 MG tablet Take 500 mg by mouth at bedtime.   omeprazole (PRILOSEC OTC) 20 MG tablet Take 20 mg by mouth in the morning.   polyethylene glycol (MIRALAX / GLYCOLAX) packet Take 17 g by mouth daily as needed (constipation).   [DISCONTINUED] gabapentin (NEURONTIN) 100 MG capsule Take 1 capsule (100 mg total) by mouth 2 (two) times daily as needed for up to 10 days (Avoid driving and take as needed for pain).   No facility-administered encounter medications on file as of 04/26/2022.    ALLERGIES: Allergies  Allergen Reactions   Codeine Nausea Only    VACCINATION STATUS: Immunization History  Administered Date(s) Administered   Influenza-Unspecified 12/05/2014, 12/04/2017     HPI  Maria Morrison is 77 y.o. female who presents today with a medical history as above. she is  being seen in follow up after being seen in consultation for hyperthyroidism requested by Maria Samples, PA-C.  she has been dealing with symptoms of loose bowels, hot flashes, and tremors intermittently for 10-15 years. These symptoms are progressively worsening and troubling to her.  her most recent thyroid labs revealed marginally suppressed TSH of 0.359 on 10/13/21.  she denies dysphagia, choking, shortness of breath, no recent voice change.    she denies known family history of thyroid dysfunction and denies family hx of thyroid cancer. she denies personal history of goiter. she is not on any anti-thyroid medications nor on any thyroid hormone supplements. Denies use of Biotin containing supplements.  she is willing to proceed with  appropriate work up and therapy for thyrotoxicosis.  She has never had any imaging of her thyroid in the past, nor been exposed to radiation.  She mentions her PCP thinks her symptoms and suppression of her TSH could be contributed by her Estradiol and Progesterone use (prescribed to control hot flashes).   Review of systems  Constitutional: + Minimally fluctuating body weight, current Body mass index is 35.11 kg/m., no fatigue, + subjective hyperthermia-improved since stopping her hormones, no subjective hypothermia Eyes: no blurry vision, no xerophthalmia ENT: no sore throat, no nodules palpated in throat, no dysphagia/odynophagia, no hoarseness Cardiovascular: no chest pain, no shortness of breath, no palpitations, no leg swelling Respiratory: no cough, no shortness of breath Gastrointestinal: no nausea/vomiting/diarrhea, chronic loose bowels Musculoskeletal: no muscle/joint aches Skin: no rashes, no hyperemia Neurological: + intermittent tremors-stable, no numbness, no tingling, no dizziness Psychiatric: no depression, no anxiety   Objective:    BP 138/72   Pulse 60   Ht 5' 3"$  (1.6 m)   Wt 198 lb 3.2 oz (89.9 kg)   BMI 35.11 kg/m   Wt Readings from Last 3 Encounters:  04/26/22 198 lb 3.2 oz (89.9 kg)  04/10/22 200 lb 3.2 oz (90.8 kg)  01/24/22 196 lb 6.4 oz (89.1 kg)     BP Readings from Last 3 Encounters:  04/26/22 138/72  04/10/22 (!) 169/76  01/24/22 119/76    Physical Exam- Limited  Constitutional:  Body mass index is 35.11 kg/m. , not in acute distress, normal state of mind Eyes:  EOMI, no exophthalmos Musculoskeletal: no gross deformities, strength intact in all four extremities, no gross restriction of joint movements Skin:  no rashes, no hyperemia Neurological: + slight tremor with outstretched hands, DTR normal in BLE   CMP  No results found for: "NA", "K", "CL", "CO2", "GLUCOSE", "BUN", "CREATININE", "CALCIUM", "PROT", "ALBUMIN", "AST", "ALT",  "ALKPHOS", "BILITOT", "GFRNONAA", "GFRAA"   CBC No results found for: "WBC", "RBC", "HGB", "HCT", "PLT", "MCV", "MCH", "MCHC", "RDW", "LYMPHSABS", "MONOABS", "EOSABS", "BASOSABS"   Diabetic Labs (most recent): Lab Results  Component Value Date   HGBA1C 6 10/13/2021    Lipid Panel  No results found for: "CHOL", "TRIG", "HDL", "CHOLHDL", "VLDL", "LDLCALC", "LDLDIRECT", "LABVLDL"   Lab Results  Component Value Date   TSH 0.644 04/18/2022   TSH 0.430 (L) 01/17/2022   TSH 0.36 (A) 10/13/2021   FREET4 1.04 04/18/2022   FREET4 0.99 01/17/2022      Latest Reference Range & Units 10/13/21 00:00 01/17/22 10:11 04/18/22 11:37  TSH 0.450 - 4.500 uIU/mL 0.36 ! (E) 0.430 (L) 0.644  Triiodothyronine,Free,Serum 2.0 - 4.4 pg/mL  3.2 3.3  T4,Free(Direct) 0.82 - 1.77 ng/dL  0.99 1.04  Thyroperoxidase Ab SerPl-aCnc 0 - 34 IU/mL  <9   Thyroglobulin Antibody 0.0 - 0.9 IU/mL  <  1.0   !: Data is abnormal (L): Data is abnormally low (E): External lab result   Assessment & Plan:   1. Abnormal TSH 2. Subclinical Hyperthyroidism  she is being seen at a kind request of Maria Samples, PA-C.  Her repeat thyroid function tests are consistent with euthyroid presentation.  She does not need antithyroid treatment or thyroid hormone replacement at this time.    Her Free T4 and Free T3 were normal and her antibody testing was negative, ruling out autoimmune thyroid disease.  She does not need any antithyroid treatment or thyroid hormone replacement at this time.  We will need to repeat her thyroid labs again in 3 months for surveillance.   She could possibly benefit from ultrasound to assess thyroid anatomy, since R lobe is bigger than left during exam today.     -Patient is advised to maintain close follow up with Maria Samples, PA-C for primary care needs.    I spent  22  minutes in the care of the patient today including review of labs from Thyroid Function, CMP, and other relevant  labs ; imaging/biopsy records (current and previous including abstractions from other facilities); face-to-face time discussing  her lab results and symptoms, medications doses, her options of short and long term treatment based on the latest standards of care / guidelines;   and documenting the encounter.  Maria Morrison  participated in the discussions, expressed understanding, and voiced agreement with the above plans.  All questions were answered to her satisfaction. she is encouraged to contact clinic should she have any questions or concerns prior to her return visit.  Follow up plan: Return in about 6 months (around 10/25/2022) for Thyroid follow up, Previsit labs.   Thank you for involving me in the care of this pleasant patient, and I will continue to update you with her progress.    Maria Morrison, [facility] [address] [place], [place] [address] Phone: [phone] Fax: [phone]  04/26/2022, 11:16 AM

## 2022-05-15 ENCOUNTER — Ambulatory Visit (HOSPITAL_COMMUNITY)
Admission: RE | Admit: 2022-05-15 | Discharge: 2022-05-15 | Disposition: A | Discharge status: Home / Self Care | Payer: Medicare Other | Source: Ambulatory Visit | Attending: Family Medicine | Admitting: Family Medicine

## 2022-05-15 DIAGNOSIS — Z1231 Encounter for screening mammogram for malignant neoplasm of breast: Secondary | ICD-10-CM | POA: Diagnosis not present

## 2022-07-11 ENCOUNTER — Ambulatory Visit (INDEPENDENT_AMBULATORY_CARE_PROVIDER_SITE_OTHER): Payer: Medicare Other | Admitting: Orthopedic Surgery

## 2022-07-11 ENCOUNTER — Encounter: Payer: Self-pay | Admitting: Orthopedic Surgery

## 2022-07-11 DIAGNOSIS — M1711 Unilateral primary osteoarthritis, right knee: Secondary | ICD-10-CM

## 2022-07-11 NOTE — Progress Notes (Signed)
Return patient Visit  Assessment: Maria Morrison is a 77 y.o. female with the following: Right knee arthritis  Plan: Maria Morrison has severe pain in the right knee, with advanced degenerative changes noted on x-ray.  Prior injection only provided symptomatic relief for couple of days.  At this point, she is interested in considering surgery.  Based on the radiographic findings, and her failure with nonoperative measures including medications, bracing, injections use of a cane and activity modifications, I think is reasonable for her to discuss total knee arthroplasty with Dr. Romeo Apple.   Follow-up: Return for Dr. Romeo Apple.  Subjective:  Chief Complaint  Patient presents with   Knee Pain    R knee pain     History of Present Illness: Maria Morrison is a 77 y.o. female who returns to clinic today for repeat evaluation of right knee pain.  I saw her in clinic a few months ago.  At that time, I injected her right knee.  She had limited improvement in her symptoms for a few days.  Since then, the pain is returned.  She continues to use a cane to assist with ambulation.  Medications have not been helpful.  Pain is starting to bother her more consistently.  She is interested in discussing total knee arthroplasty.   Review of Systems: No fevers or chills No numbness or tingling No chest pain No shortness of breath No bowel or bladder dysfunction No GI distress No headaches  Objective: There were no vitals taken for this visit.  Physical Exam:  General: Alert and oriented. and No acute distress. Gait: Slow, steady gait.  Bilateral knees without effusion.  No bruising.  No swelling.  Good range of motion bilaterally.  Tenderness to palpation over the medial joint line.  No increased laxity to varus or valgus stress.  Varus alignment overall.  IMAGING: I personally ordered and reviewed the following images  X-rays of the right knee were evaluated.  Varus angulation  overall.  Complete loss of joint space within the medial compartment.  New Medications:  No orders of the defined types were placed in this encounter.     Oliver Barre, MD  07/11/2022 5:22 PM

## 2022-07-11 NOTE — Patient Instructions (Signed)
Follow up with Dr. Romeo Apple for discussion regarding knee replacement

## 2022-07-20 ENCOUNTER — Ambulatory Visit: Payer: Medicare Other | Admitting: Orthopedic Surgery

## 2022-07-20 ENCOUNTER — Encounter: Payer: Self-pay | Admitting: Orthopedic Surgery

## 2022-07-20 VITALS — BP 146/68 | HR 71 | Ht 63.0 in | Wt 197.0 lb

## 2022-07-20 DIAGNOSIS — Z01818 Encounter for other preprocedural examination: Secondary | ICD-10-CM

## 2022-07-20 MED ORDER — BUPIVACAINE-MELOXICAM ER 400-12 MG/14ML IJ SOLN: 400.0000 mg | Freq: Once | INTRAMUSCULAR | Status: AC

## 2022-07-20 NOTE — Progress Notes (Signed)
Office Visit Note   Patient: Maria Morrison           Date of Birth: 12/24/1945           MRN: [id number] Visit Date: 07/20/2022 Requested by: Avis Epley, PA-C [address] [place],  [place] [address] PCP: Assunta Found, MD   Assessment & Plan: The patient is agreeable after discussion of risks and benefits of surgery to right total knee arthroplasty  She has 3 steps to get into her house she has bathroom and a place to sleep on the main floor  The procedure has been fully reviewed with the patient; The risks and benefits of surgery have been discussed and explained and understood. Alternative treatment has also been reviewed, questions were encouraged and answered. The postoperative plan is also been reviewed.  We reviewed the increased risks of surgery with diabetics in patients with varicose veins which include increased risk of infection and increased risk of blood clot  We also reminded her that her right leg would get longer after surgery   No orders of the defined types were placed in this encounter.    Subjective: Chief Complaint  Patient presents with   Knee Pain    Right     HPI: 77 year old female with longstanding osteoarthritis of the right knee but symptomatic only for 1 year.  She describes a vague history of avascular necrosis of the femur but does not reported being in the hip or knee?  She has disabling right knee pain at this time she is using a cane she had an injection it did not help she tried some over-the-counter medications again without relief  She is diabetic she does have varicose veins  Her hemoglobin A1c was 6                ROS: No fever chest pain or shortness of breath all other systems were reviewed and were negative   Images personally read and my interpretation : Plain films show varus deformity osteoarthritis grade 4 she does have some osteophytes that need to be addressed  Visit Diagnoses: No diagnosis  found.   Follow-Up Instructions: No follow-ups on file.    Objective: Vital Signs: BP (!) 146/68   Pulse 71   Ht 5\' 3"  (1.6 m)   Wt 197 lb (89.4 kg)   BMI 34.90 kg/m   Physical Exam Vitals and nursing note reviewed.  Constitutional:      Appearance: Normal appearance.  HENT:     Head: Normocephalic and atraumatic.  Eyes:     General: No scleral icterus.       Right eye: No discharge.        Left eye: No discharge.     Extraocular Movements: Extraocular movements intact.     Conjunctiva/sclera: Conjunctivae normal.     Pupils: Pupils are equal, round, and reactive to light.  Cardiovascular:     Rate and Rhythm: Normal rate.     Pulses: Normal pulses.  Musculoskeletal:     Right knee: Effusion present.  Skin:    General: Skin is warm and dry.     Capillary Refill: Capillary refill takes less than 2 seconds.  Neurological:     General: No focal deficit present.     Mental Status: She is alert and oriented to person, place, and time.  Psychiatric:        Mood and Affect: Mood normal.        Behavior: Behavior normal.  Thought Content: Thought content normal.        Judgment: Judgment normal.      Right Knee Exam   Tenderness  The patient is experiencing tenderness in the medial joint line and patella.  Range of Motion  Extension:  5  Right knee flexion: 108 by goniometer.   Tests  Varus: negative Valgus: negative Drawer:  Anterior - negative    Posterior - negative  Other  Erythema: absent Scars: absent Sensation: normal Pulse: present Swelling: none Effusion: effusion present  Comments:  Varicose veins, no edema  Circumference of the thigh large   Left Knee Exam   Comments:  Varicose veins, no edema       Specialty Comments:  No specialty comments available.  Imaging: No results found.   PMFS History: Patient Active Problem List   Diagnosis Date Noted   Gastroesophageal reflux disease 04/28/2020   Rectal bleeding  09/24/2019   Constipation 09/24/2019   Rectal pain 09/24/2019   History of colonic polyps    Past Medical History:  Diagnosis Date   Diabetes mellitus without complication (HCC)    GERD (gastroesophageal reflux disease)    HLD (hyperlipidemia)    HTN (hypertension)     Family History  Problem Relation Age of Onset   Hypertension Mother    Stroke Mother    Hypertension Father    Diabetes Father    Parkinson's disease Father    Stroke Maternal Grandfather    Stroke Paternal Grandmother    Stroke Paternal Grandfather    Colon cancer Neg Hx     Past Surgical History:  Procedure Laterality Date   APPENDECTOMY     BACK SURGERY     COLONOSCOPY N/A 02/25/2014   Procedure: COLONOSCOPY;  Surgeon: Corbin Ade, MD;  two tubular adenomas, anal papilla, internal hemorrhoids.  Recommended repeat colonoscopy in 5 years.   COLONOSCOPY N/A 06/16/2020   Surgeon: Corbin Ade, MD;  entirely normal exam.  No recommendations to repeat due to age.   INJECTION KNEE Right 04/2022   TUBAL LIGATION     Social History   Occupational History    Comment: retired  Tobacco Use   Smoking status: Former   Smokeless tobacco: Never  Substance and Sexual Activity   Alcohol use: No    Alcohol/week: 0.0 standard drinks of alcohol   Drug use: No   Sexual activity: Not on file

## 2022-07-20 NOTE — Patient Instructions (Signed)
Your surgery will be at [facility] by Dr Harrison  plan to be in hospital overnight. The hospital will contact you with a preoperative appointment to discuss Anesthesia.  Please arrive on time or 15 minutes early for the preoperative appointment, they have a very tight schedule if you are late or do not come in your surgery will be cancelled.  The phone number for the preop area is [phone]. Please bring your medications with you for the appointment. They will tell you the arrival time for surgery and medication instructions when you have your preoperative evaluation. Do not wear nail polish the day of your surgery and if you take Phentermine you need to stop this medication ONE WEEK prior to your surgery. f you take Invokana, Farxiga, Jardiance, or Steglatro) - Hold 72 hours before the procedure.  If you take Ozempic,  Bydureon or Trulicity do not take for 8 days before your surgery. If you take Victoza, Rybelsis, Saxenda or Adlyxi stop 24 hours before the procedure. Please arrive at the hospital 2 hours before procedure if scheduled at 9:30 or later in the day or at the time the nurse tells you at your preoperative visit.   If you have my chart do not use the time given in my chart use the time given to you by the nurse during your preoperative visit.   Your surgery  time may change. Please be available for phone calls the day of your surgery and the day before. The Short Stay department may need to discuss changes about your surgery time. Not reaching the you could lead to procedure delays and possible cancellation.  You must have a ride home and someone to stay with you for 24 to 48 hours. The person taking you home will receive and sign for the your discharge instructions.  Please be prepared to give your support person's name and telephone number to [facility] [employer]. Dr Harrison will need that name and phone number post procedure.   You will also get a call from a representative of [employer], they have a machine that you will use in the first few weeks after surgery. It is called a CPM.   You will have home physical therapy for 2 weeks after surgery, the home health agency will call you before or just following the surgery to set up visits. [facility] is the agency we normally use, unless you request another agency.   You will get a call also from outpatient therapy for therapy starting when the home therapy is done.  If you have questions or need to Reschedule the surgery, call the office ask for Joliyah Lippens.    

## 2022-07-25 NOTE — Patient Instructions (Signed)
Maria Morrison  07/25/2022     @PREFPERIOPPHARMACY @   Your procedure is scheduled on  08/01/2022.   Report to [facility] at  0600  A.M.   Call this number if you have problems the morning of surgery:  [phone]  If you experience any cold or flu symptoms such as cough, fever, chills, shortness of breath, etc. between now and your scheduled surgery, please notify us at the above number.   Remember:       You should start you pre-op CHG showers on Friday night 07/28/2022 (See enclosed instructions for this).    Do not eat or drink after midnight.        DO NOT take any medications for diabetes the morning of your procedure.     Take these medicines the morning of surgery with A SIP OF WATER                                    omeprazole.     Do not wear jewelry, make-up or nail polish, including gel polish,  artificial nails, or any other type of covering on natural nails (fingers and  toes).    Do not wear lotions, powders, or perfumes, or deodorant.   Do not shave 48 hours prior to surgery.  Men may shave face and neck.   Do not bring valuables to the hospital.  [facility] is not responsible for any belongings or valuables.   Contacts, dentures or bridgework may not be worn into surgery.  Leave your suitcase in the car.  After surgery it may be brought to your room.  For patients admitted to the hospital, discharge time will be determined by your treatment team.  Patients discharged the day of surgery will not be allowed to drive home and must have someone with them for 24 hours.    Special instructions:   DO NOT smoke tobacco or vape for 24 hours before your procedure.  Please read over the following fact sheets that you were given. Pain Booklet, Coughing and Deep Breathing, Blood Transfusion Information, Lab Information, Total Joint Packet, MRSA Information, Surgical Site Infection Prevention, Anesthesia Post-op Instructions, and Care and  Recovery After Surgery      Total Knee Replacement, Care After This sheet gives you information about how to care for yourself after your procedure. Your health care provider may also give you more specific instructions. If you have problems or questions, contact your health care provider. What can I expect after the procedure? After the procedure, it is common to have: Redness, pain, and swelling at the incision area. Stiffness. Discomfort. A small amount of blood or clear fluid coming from your incision. Follow these instructions at home: Medicines Take over-the-counter and prescription medicines only as told by your health care provider. If you were prescribed a blood thinner (anticoagulant), take it as told by your health care provider. Ask your health care provider if the medicine prescribed to you: Requires you to avoid driving or using machinery. Can cause constipation. You may need to take these actions to prevent or treat constipation: Drink enough fluid to keep your urine pale yellow. Take over-the-counter or prescription medicines. Eat foods that are high in fiber, such as beans, whole grains, and fresh fruits and vegetables. Limit foods that are high in fat and processed sugars, such as fried or sweet foods. Incision care  Follow instructions from your health care provider about how to take care of your incision. Make sure you: Wash your hands with soap and water for at least 20 seconds before and after you change your bandage (dressing). If soap and water are not available, use hand sanitizer. Change your dressing as told by your health care provider. Leave stitches (sutures), staples, skin glue, or adhesive strips in place. These skin closures may need to stay in place for 2 weeks or longer. If adhesive strip edges start to loosen and curl up, you may trim the loose edges. Do not remove adhesive strips completely unless your health care provider tells you to do that. Do not  take baths, swim, or use a hot tub until your health care provider approves. Check your incision area every day for signs of infection. Check for: More redness, swelling, or pain. More fluid or blood. Warmth. Pus or a bad smell. Activity Rest as told by your health care provider. Avoid sitting for a long time without moving. Get up to take short walks every 1-2 hours. This is important to improve blood flow and breathing. Ask for help if you feel weak or unsteady. Follow instructions from your health care provider about using a walker, crutches, or a cane. You may use your legs to support (bear) your body weight as told by your health care provider. Follow instructions about how much weight you may safely support on your affected leg (weight-bearing restrictions). A physical therapist may show you how to get out of a bed and chair and how to go up and down stairs. You will first do this with a walker, crutches, or a cane and then without any of these devices. Once you are able to walk without a limp, you may stop using a walker, crutches, or a cane. Do exercises as told by your health care provider or physical therapist. Avoid high-impact activities, including running, jumping rope, and doing jumping jacks. Do not play contact sports until your health care provider approves. Return to your normal activities as told by your health care provider. Ask your health care provider what activities are safe for you. Managing pain, stiffness, and swelling  If directed, put ice on your knee. To do this: Put ice in a plastic bag or use the icing device (cold flow pad) that you were given. Follow instructions from your health care provider about how to use the icing device. Place a towel between your skin and the bag or between your skin and the icing device. Leave the ice on for 20 minutes, 2-3 times a day. Remove the ice if your skin turns bright red. This is very important. If you cannot feel pain,  heat, or cold, you have a greater risk of damage to the area. Move your toes often to reduce stiffness and swelling. Raise (elevate) your leg above the level of your heart while you are sitting or lying down. Use several pillows to keep your leg straight. Do not put a pillow just under the knee. If the knee is bent for a long time, this may lead to stiffness. Wear elastic knee support as told by your health care provider. Safety  To help prevent falls, keep floors clear of objects you may trip over. Place items that you may need within easy reach. Wear an apron or tool belt with pockets for carrying objects. This leaves your hands free to help with your balance. Ask your health care provider when it is safe  to drive. General instructions Wear compression stockings as told by your health care provider. These stockings help to prevent blood clots and reduce swelling in your legs. Continue with breathing exercises. This helps prevent lung infection. Do not use any products that contain nicotine or tobacco. These products include cigarettes, chewing tobacco, and vaping devices, such as e-cigarettes. These can delay healing after surgery. If you need help quitting, ask your health care provider. Tell your health care provider if you plan to have dental work. Also: Tell your dentist about your joint replacement. Ask your health care provider if there are any special instructions you need to follow before having dental care and routine cleanings. Keep all follow-up visits. This is important. Contact a health care provider if: You have a fever or chills. You have a cough or feel short of breath. Your medicine is not controlling your pain. You have any of these signs of infection: More redness, swelling, or pain around your incision. More fluid or blood coming from your incision. Warmth coming from your incision. Pus or a bad smell coming from your incision. You fall. Get help right away if: You  have severe pain. You have trouble breathing. You have chest pain. You have redness, swelling, pain, or warmth in your calf or leg. Your incision breaks open after sutures or staples are removed. These symptoms may represent a serious problem that is an emergency. Do not wait to see if the symptoms will go away. Get medical help right away. Call your local emergency services (911 in the [place] Do not drive yourself to the hospital. Summary After the procedure, it is common to have pain and swelling at the incision area, a small amount of blood or fluid coming from your incision, and stiffness. Follow instructions from your health care provider about how to take care of your incision. Use crutches, a walker, or a cane as told by your health care provider. This information is not intended to replace advice given to you by your health care provider. Make sure you discuss any questions you have with your health care provider. Document Revised: 08/12/2019 Document Reviewed: 08/12/2019 Elsevier Patient Education  2023 [employer] Spinal Anesthesia and Epidural Anesthesia, Care After While the medicines you were given are still having effects, it is common to: Feel itchy. Vomit or feel like you may vomit. Feel dizzy. Have a numb feeling and tingling in your legs. Have problems when you pee (urinate). Follow these instructions at home: For the time period you were told by your doctor: Rest. Do not do activities where you could fall or get hurt. Do not drive or use machines. Do not drink alcohol. Do not take sleeping pills or medicines that make you drowsy. Do not make big decisions or sign legal documents. Do not take care of children on your own. Medicines  Take over-the-counter and prescription medicines only as told by your doctor. If you were prescribed antibiotics, take them as told by your doctor. Do not stop taking them even if you start to feel better. Eating and drinking Follow  instructions from your doctor about what you may eat and drink. Drink enough fluid to keep your pee (urine) pale yellow. If you vomit, wait a short time. When you can drink without vomiting, try some water, juice, or clear soup. General instructions If you have breathing problems while you sleep (sleep apnea), you may be more likely to have breathing problems after surgery and when you take certain medicines. Ask  your doctor when you should wear your breathing device. Wear compression stockings as told by your doctor. Return to your normal activities when your doctor says that it is safe. Your doctor may give you more instructions. Make sure you know what you can and cannot do. Contact a doctor if: You still feel like you may vomit or are vomiting more than a day after you were given the numbing medicine. You get a rash. You have a fever. Get help right away if: You have a headache that lasts a long time. You have a very bad headache. Your vision is blurry, or you see two of a single object (double vision). You are dizzy or light-headed. You faint. Your arms or legs feel weak, tingle, or are numb for a long time. You have trouble breathing. You cannot pee. These symptoms may be an emergency. Get help right away. Call 911. Do not wait to see if the symptoms will go away. Do not drive yourself to the hospital. This information is not intended to replace advice given to you by your health care provider. Make sure you discuss any questions you have with your health care provider. Document Revised: 09/22/2021 Document Reviewed: 09/22/2021 Elsevier Patient Education  2023 [employer]. General Anesthesia, Adult, Care After The following information offers guidance on how to care for yourself after your procedure. Your health care provider may also give you more specific instructions. If you have problems or questions, contact your health care provider. What can I expect after the  procedure? After the procedure, it is common for people to: Have pain or discomfort at the IV site. Have nausea or vomiting. Have a sore throat or hoarseness. Have trouble concentrating. Feel cold or chills. Feel weak, sleepy, or tired (fatigue). Have soreness and body aches. These can affect parts of the body that were not involved in surgery. Follow these instructions at home: For the time period you were told by your health care provider:  Rest. Do not participate in activities where you could fall or become injured. Do not drive or use machinery. Do not drink alcohol. Do not take sleeping pills or medicines that cause drowsiness. Do not make important decisions or sign legal documents. Do not take care of children on your own. General instructions Drink enough fluid to keep your urine pale yellow. If you have sleep apnea, surgery and certain medicines can increase your risk for breathing problems. Follow instructions from your health care provider about wearing your sleep device: Anytime you are sleeping, including during daytime naps. While taking prescription pain medicines, sleeping medicines, or medicines that make you drowsy. Return to your normal activities as told by your health care provider. Ask your health care provider what activities are safe for you. Take over-the-counter and prescription medicines only as told by your health care provider. Do not use any products that contain nicotine or tobacco. These products include cigarettes, chewing tobacco, and vaping devices, such as e-cigarettes. These can delay incision healing after surgery. If you need help quitting, ask your health care provider. Contact a health care provider if: You have nausea or vomiting that does not get better with medicine. You vomit every time you eat or drink. You have pain that does not get better with medicine. You cannot urinate or have bloody urine. You develop a skin rash. You have a  fever. Get help right away if: You have trouble breathing. You have chest pain. You vomit blood. These symptoms may be an emergency.  Get help right away. Call 911. Do not wait to see if the symptoms will go away. Do not drive yourself to the hospital. Summary After the procedure, it is common to have a sore throat, hoarseness, nausea, vomiting, or to feel weak, sleepy, or fatigue. For the time period you were told by your health care provider, do not drive or use machinery. Get help right away if you have difficulty breathing, have chest pain, or vomit blood. These symptoms may be an emergency. This information is not intended to replace advice given to you by your health care provider. Make sure you discuss any questions you have with your health care provider. Document Revised: 05/20/2021 Document Reviewed: 05/20/2021 Elsevier Patient Education  2023 [employer].

## 2022-07-26 ENCOUNTER — Encounter (HOSPITAL_COMMUNITY): Payer: Self-pay

## 2022-07-26 ENCOUNTER — Encounter (HOSPITAL_COMMUNITY)
Admission: RE | Admit: 2022-07-26 | Discharge: 2022-07-26 | Disposition: A | Discharge status: Home / Self Care | Payer: Medicare Other | Source: Ambulatory Visit | Attending: Orthopedic Surgery | Admitting: Orthopedic Surgery

## 2022-07-26 ENCOUNTER — Telehealth: Payer: Self-pay | Admitting: Orthopedic Surgery

## 2022-07-26 VITALS — BP 141/68 | HR 71 | Temp 97.7°F | Resp 18 | Ht 63.0 in | Wt 197.0 lb

## 2022-07-26 DIAGNOSIS — Z01818 Encounter for other preprocedural examination: Secondary | ICD-10-CM | POA: Insufficient documentation

## 2022-07-26 DIAGNOSIS — E119 Type 2 diabetes mellitus without complications: Secondary | ICD-10-CM | POA: Diagnosis not present

## 2022-07-26 HISTORY — DX: Sleep apnea, unspecified: G47.30

## 2022-07-26 LAB — CBC WITH DIFFERENTIAL/PLATELET
Abs Immature Granulocytes: 0.01 10*3/uL (ref 0.00–0.07)
Basophils Absolute: 0 10*3/uL (ref 0.0–0.1)
Basophils Relative: 1 %
Eosinophils Absolute: 0.1 10*3/uL (ref 0.0–0.5)
Eosinophils Relative: 1 %
HCT: 39.9 % (ref 36.0–46.0)
Hemoglobin: 12.5 g/dL (ref 12.0–15.0)
Immature Granulocytes: 0 %
Lymphocytes Relative: 20 %
Lymphs Abs: 1.6 10*3/uL (ref 0.7–4.0)
MCH: 31.6 pg (ref 26.0–34.0)
MCHC: 31.3 g/dL (ref 30.0–36.0)
MCV: 101 fL — ABNORMAL HIGH (ref 80.0–100.0)
Monocytes Absolute: 0.9 10*3/uL (ref 0.1–1.0)
Monocytes Relative: 11 %
Neutro Abs: 5.4 10*3/uL (ref 1.7–7.7)
Neutrophils Relative %: 67 %
Platelets: 268 10*3/uL (ref 150–400)
RBC: 3.95 MIL/uL (ref 3.87–5.11)
RDW: 12.6 % (ref 11.5–15.5)
WBC: 8 10*3/uL (ref 4.0–10.5)
nRBC: 0 % (ref 0.0–0.2)

## 2022-07-26 LAB — BASIC METABOLIC PANEL
Anion gap: 9 (ref 5–15)
BUN: 19 mg/dL (ref 8–23)
CO2: 24 mmol/L (ref 22–32)
Calcium: 9.9 mg/dL (ref 8.9–10.3)
Chloride: 103 mmol/L (ref 98–111)
Creatinine, Ser: 0.82 mg/dL (ref 0.44–1.00)
GFR, Estimated: >60 mL/min (ref >60)
Glucose, Bld: 103 mg/dL — ABNORMAL HIGH (ref 70–99)
Potassium: 3.9 mmol/L (ref 3.5–5.1)
Sodium: 136 mmol/L (ref 135–145)

## 2022-07-26 LAB — PREPARE RBC (CROSSMATCH)

## 2022-07-26 LAB — HEMOGLOBIN A1C
Hgb A1c MFr Bld: 6.2 % — ABNORMAL HIGH (ref 4.8–5.6)
Mean Plasma Glucose: 131.24 mg/dL

## 2022-07-26 LAB — SURGICAL PCR SCREEN
MRSA, PCR: NEGATIVE
Staphylococcus aureus: NEGATIVE

## 2022-07-26 NOTE — Telephone Encounter (Signed)
Dr. Mort Sawyers pt - spoke w/the patient, she stated she is scheduled for surgery next week and when she went for her preop they suggested she request a script for a walker.

## 2022-07-27 NOTE — Telephone Encounter (Signed)
Calls are blocked and screened by call blocker, yes she needs a walker I left order at front desk for her.

## 2022-08-01 ENCOUNTER — Encounter (HOSPITAL_COMMUNITY)
Admission: RE | Disposition: A | Discharge status: Home Health | Payer: Self-pay | Source: Home / Self Care | Attending: Orthopedic Surgery

## 2022-08-01 ENCOUNTER — Ambulatory Visit (HOSPITAL_COMMUNITY): Payer: Medicare Other | Admitting: Certified Registered"

## 2022-08-01 ENCOUNTER — Observation Stay (HOSPITAL_COMMUNITY)
Admission: RE | Admit: 2022-08-01 | Discharge: 2022-08-02 | Disposition: A | Discharge status: Home Health | Payer: Medicare Other | Attending: Orthopedic Surgery | Admitting: Orthopedic Surgery

## 2022-08-01 ENCOUNTER — Ambulatory Visit (HOSPITAL_BASED_OUTPATIENT_CLINIC_OR_DEPARTMENT_OTHER): Payer: Medicare Other | Admitting: Certified Registered"

## 2022-08-01 ENCOUNTER — Ambulatory Visit (HOSPITAL_COMMUNITY): Payer: Medicare Other

## 2022-08-01 ENCOUNTER — Encounter (HOSPITAL_COMMUNITY): Payer: Self-pay | Admitting: Orthopedic Surgery

## 2022-08-01 ENCOUNTER — Other Ambulatory Visit: Payer: Self-pay

## 2022-08-01 DIAGNOSIS — M94261 Chondromalacia, right knee: Secondary | ICD-10-CM

## 2022-08-01 DIAGNOSIS — M1711 Unilateral primary osteoarthritis, right knee: Secondary | ICD-10-CM | POA: Diagnosis not present

## 2022-08-01 DIAGNOSIS — Z471 Aftercare following joint replacement surgery: Secondary | ICD-10-CM | POA: Diagnosis not present

## 2022-08-01 DIAGNOSIS — M25761 Osteophyte, right knee: Secondary | ICD-10-CM

## 2022-08-01 DIAGNOSIS — E119 Type 2 diabetes mellitus without complications: Secondary | ICD-10-CM | POA: Insufficient documentation

## 2022-08-01 DIAGNOSIS — Z79899 Other long term (current) drug therapy: Secondary | ICD-10-CM | POA: Insufficient documentation

## 2022-08-01 DIAGNOSIS — G4730 Sleep apnea, unspecified: Secondary | ICD-10-CM | POA: Diagnosis not present

## 2022-08-01 DIAGNOSIS — I10 Essential (primary) hypertension: Secondary | ICD-10-CM | POA: Insufficient documentation

## 2022-08-01 DIAGNOSIS — Z96651 Presence of right artificial knee joint: Secondary | ICD-10-CM | POA: Diagnosis not present

## 2022-08-01 DIAGNOSIS — Z87891 Personal history of nicotine dependence: Secondary | ICD-10-CM | POA: Diagnosis not present

## 2022-08-01 HISTORY — PX: TOTAL KNEE ARTHROPLASTY: SHX125

## 2022-08-01 LAB — ABO/RH: ABO/RH(D): A POS

## 2022-08-01 LAB — GLUCOSE, CAPILLARY
Glucose-Capillary: 92 mg/dL (ref 70–99)
Glucose-Capillary: 108 mg/dL — ABNORMAL HIGH (ref 70–99)
Glucose-Capillary: 174 mg/dL — ABNORMAL HIGH (ref 70–99)

## 2022-08-01 LAB — BPAM RBC: Unit Type and Rh: 6200

## 2022-08-01 SURGERY — ARTHROPLASTY, KNEE, TOTAL
Anesthesia: General | Site: Knee | Laterality: Right

## 2022-08-01 MED ORDER — POLYETHYLENE GLYCOL 3350 17 G PO PACK: 17.0000 g | PACK | Freq: Every day | ORAL | Status: DC | PRN

## 2022-08-01 MED ORDER — MELATONIN 3 MG PO TABS
3.0000 mg | ORAL_TABLET | Freq: Every day | ORAL | Status: DC
  Administered 2022-08-01: 3 mg via ORAL
  Filled 2022-08-01 (×2): qty 1

## 2022-08-01 MED ORDER — CHLORHEXIDINE GLUCONATE 0.12 % MT SOLN: 15.0000 mL | Freq: Once | OROMUCOSAL | Status: DC

## 2022-08-01 MED ORDER — 0.9 % SODIUM CHLORIDE (POUR BTL) OPTIME
TOPICAL | Status: DC | PRN
  Administered 2022-08-01: 1000 mL

## 2022-08-01 MED ORDER — OXYCODONE HCL 5 MG PO TABS
5.0000 mg | ORAL_TABLET | Freq: Once | ORAL | Status: AC
  Administered 2022-08-01: 5 mg via ORAL
  Filled 2022-08-01: qty 1

## 2022-08-01 MED ORDER — PHENOL 1.4 % MT LIQD: 1.0000 | OROMUCOSAL | Status: DC | PRN

## 2022-08-01 MED ORDER — SURGIPHOR WOUND IRRIGATION SYSTEM - OPTIME
TOPICAL | Status: DC | PRN
  Administered 2022-08-01: 450 mL via TOPICAL

## 2022-08-01 MED ORDER — ROPIVACAINE HCL 5 MG/ML IJ SOLN
INTRAMUSCULAR | Status: AC
  Filled 2022-08-01: qty 30

## 2022-08-01 MED ORDER — ONDANSETRON HCL 4 MG/2ML IJ SOLN: 4.0000 mg | Freq: Once | INTRAMUSCULAR | Status: DC | PRN

## 2022-08-01 MED ORDER — CEFAZOLIN SODIUM-DEXTROSE 2-4 GM/100ML-% IV SOLN
2.0000 g | Freq: Four times a day (QID) | INTRAVENOUS | Status: AC
  Administered 2022-08-01 (×2): 2 g via INTRAVENOUS
  Filled 2022-08-01 (×2): qty 100

## 2022-08-01 MED ORDER — PHENYLEPHRINE 80 MCG/ML (10ML) SYRINGE FOR IV PUSH (FOR BLOOD PRESSURE SUPPORT)
PREFILLED_SYRINGE | INTRAVENOUS | Status: DC | PRN
  Administered 2022-08-01 (×2): 80 ug via INTRAVENOUS
  Administered 2022-08-01: 160 ug via INTRAVENOUS

## 2022-08-01 MED ORDER — ACETAMINOPHEN 325 MG PO TABS
325.0000 mg | ORAL_TABLET | Freq: Four times a day (QID) | ORAL | Status: DC | PRN
  Administered 2022-08-02: 650 mg via ORAL
  Filled 2022-08-01: qty 2

## 2022-08-01 MED ORDER — ROPIVACAINE HCL 5 MG/ML IJ SOLN
INTRAMUSCULAR | Status: DC | PRN
  Administered 2022-08-01: 30 mL via PERINEURAL

## 2022-08-01 MED ORDER — CELECOXIB 400 MG PO CAPS
400.0000 mg | ORAL_CAPSULE | Freq: Once | ORAL | Status: AC
  Administered 2022-08-01: 400 mg via ORAL
  Filled 2022-08-01: qty 1

## 2022-08-01 MED ORDER — MIDAZOLAM HCL 2 MG/2ML IJ SOLN
INTRAMUSCULAR | Status: AC
  Filled 2022-08-01: qty 2

## 2022-08-01 MED ORDER — FENTANYL CITRATE (PF) 250 MCG/5ML IJ SOLN
INTRAMUSCULAR | Status: DC | PRN
  Administered 2022-08-01: 85 ug via INTRAVENOUS

## 2022-08-01 MED ORDER — METOCLOPRAMIDE HCL 5 MG/ML IJ SOLN
5.0000 mg | Freq: Three times a day (TID) | INTRAMUSCULAR | Status: DC | PRN
  Administered 2022-08-01: 10 mg via INTRAVENOUS
  Filled 2022-08-01: qty 2

## 2022-08-01 MED ORDER — EPHEDRINE 5 MG/ML INJ
INTRAVENOUS | Status: AC
  Filled 2022-08-01: qty 5

## 2022-08-01 MED ORDER — LISINOPRIL 10 MG PO TABS
10.0000 mg | ORAL_TABLET | Freq: Every day | ORAL | Status: DC
  Filled 2022-08-01: qty 1

## 2022-08-01 MED ORDER — HYDROMORPHONE HCL 1 MG/ML IJ SOLN
0.5000 mg | INTRAMUSCULAR | Status: DC | PRN
  Administered 2022-08-01: 1 mg via INTRAVENOUS
  Filled 2022-08-01 (×2): qty 1

## 2022-08-01 MED ORDER — DOCUSATE SODIUM 100 MG PO CAPS
100.0000 mg | ORAL_CAPSULE | Freq: Two times a day (BID) | ORAL | Status: DC
  Administered 2022-08-01 – 2022-08-02 (×2): 100 mg via ORAL
  Filled 2022-08-01 (×2): qty 1

## 2022-08-01 MED ORDER — PHENYLEPHRINE 80 MCG/ML (10ML) SYRINGE FOR IV PUSH (FOR BLOOD PRESSURE SUPPORT)
PREFILLED_SYRINGE | INTRAVENOUS | Status: AC
  Filled 2022-08-01: qty 10

## 2022-08-01 MED ORDER — BISACODYL 5 MG PO TBEC: 5.0000 mg | DELAYED_RELEASE_TABLET | Freq: Every day | ORAL | Status: DC | PRN

## 2022-08-01 MED ORDER — METFORMIN HCL 500 MG PO TABS
500.0000 mg | ORAL_TABLET | Freq: Every day | ORAL | Status: DC
  Administered 2022-08-01: 500 mg via ORAL
  Filled 2022-08-01: qty 1

## 2022-08-01 MED ORDER — METHOCARBAMOL 500 MG PO TABS: 500.0000 mg | ORAL_TABLET | Freq: Four times a day (QID) | ORAL | Status: DC | PRN

## 2022-08-01 MED ORDER — FENTANYL CITRATE PF 50 MCG/ML IJ SOSY: 25.0000 ug | PREFILLED_SYRINGE | INTRAMUSCULAR | Status: DC | PRN

## 2022-08-01 MED ORDER — ONDANSETRON HCL 4 MG/2ML IJ SOLN
INTRAMUSCULAR | Status: DC | PRN
  Administered 2022-08-01: 4 mg via INTRAVENOUS

## 2022-08-01 MED ORDER — OXYCODONE HCL 5 MG PO TABS: 5.0000 mg | ORAL_TABLET | Freq: Once | ORAL | Status: DC | PRN

## 2022-08-01 MED ORDER — DEXAMETHASONE SODIUM PHOSPHATE 10 MG/ML IJ SOLN
INTRAMUSCULAR | Status: AC
  Filled 2022-08-01: qty 1

## 2022-08-01 MED ORDER — DEXAMETHASONE SODIUM PHOSPHATE 10 MG/ML IJ SOLN
INTRAMUSCULAR | Status: DC | PRN
  Administered 2022-08-01: 4 mg via INTRAVENOUS

## 2022-08-01 MED ORDER — ORAL CARE MOUTH RINSE: 15.0000 mL | Freq: Once | OROMUCOSAL | Status: DC

## 2022-08-01 MED ORDER — PROPOFOL 500 MG/50ML IV EMUL
INTRAVENOUS | Status: AC
  Filled 2022-08-01: qty 50

## 2022-08-01 MED ORDER — BUPIVACAINE-MELOXICAM ER 200-6 MG/7ML IJ SOLN
INTRAMUSCULAR | Status: AC
  Filled 2022-08-01: qty 2

## 2022-08-01 MED ORDER — OMEPRAZOLE MAGNESIUM 20 MG PO TBEC: 20.0000 mg | DELAYED_RELEASE_TABLET | Freq: Every morning | ORAL | Status: DC

## 2022-08-01 MED ORDER — FENTANYL CITRATE (PF) 100 MCG/2ML IJ SOLN
INTRAMUSCULAR | Status: AC
  Filled 2022-08-01: qty 2

## 2022-08-01 MED ORDER — FENTANYL CITRATE (PF) 250 MCG/5ML IJ SOLN
INTRAMUSCULAR | Status: DC | PRN
  Administered 2022-08-01: 15 ug via INTRATHECAL

## 2022-08-01 MED ORDER — EPHEDRINE SULFATE-NACL 50-0.9 MG/10ML-% IV SOSY
PREFILLED_SYRINGE | INTRAVENOUS | Status: DC | PRN
  Administered 2022-08-01 (×3): 5 mg via INTRAVENOUS

## 2022-08-01 MED ORDER — OXYCODONE HCL 5 MG PO TABS: 10.0000 mg | ORAL_TABLET | ORAL | Status: DC | PRN

## 2022-08-01 MED ORDER — ONDANSETRON HCL 4 MG/2ML IJ SOLN
INTRAMUSCULAR | Status: AC
  Filled 2022-08-01: qty 2

## 2022-08-01 MED ORDER — BUPIVACAINE IN DEXTROSE 0.75-8.25 % IT SOLN
INTRATHECAL | Status: DC | PRN
  Administered 2022-08-01: 1.6 mL via INTRATHECAL

## 2022-08-01 MED ORDER — INSULIN ASPART 100 UNIT/ML IJ SOLN
0.0000 [IU] | Freq: Three times a day (TID) | INTRAMUSCULAR | Status: DC
  Administered 2022-08-01: 2 [IU] via SUBCUTANEOUS

## 2022-08-01 MED ORDER — METHOCARBAMOL 1000 MG/10ML IJ SOLN: 500.0000 mg | Freq: Four times a day (QID) | INTRAVENOUS | Status: DC | PRN

## 2022-08-01 MED ORDER — BUPIVACAINE IN DEXTROSE 0.75-8.25 % IT SOLN
INTRATHECAL | Status: AC
  Filled 2022-08-01: qty 2

## 2022-08-01 MED ORDER — LACTATED RINGERS IV SOLN: INTRAVENOUS | Status: DC | PRN

## 2022-08-01 MED ORDER — OXYCODONE HCL 5 MG/5ML PO SOLN: 5.0000 mg | Freq: Once | ORAL | Status: DC | PRN

## 2022-08-01 MED ORDER — PANTOPRAZOLE SODIUM 40 MG PO TBEC
40.0000 mg | DELAYED_RELEASE_TABLET | Freq: Every day | ORAL | Status: DC
  Administered 2022-08-02: 40 mg via ORAL
  Filled 2022-08-01: qty 1

## 2022-08-01 MED ORDER — STERILE WATER FOR IRRIGATION IR SOLN
Status: DC | PRN
  Administered 2022-08-01 (×2): 1000 mL

## 2022-08-01 MED ORDER — LACTATED RINGERS IV SOLN: INTRAVENOUS | Status: DC

## 2022-08-01 MED ORDER — TRANEXAMIC ACID-NACL 1000-0.7 MG/100ML-% IV SOLN
1000.0000 mg | INTRAVENOUS | Status: AC
  Administered 2022-08-01: 1000 mg via INTRAVENOUS
  Filled 2022-08-01: qty 100

## 2022-08-01 MED ORDER — TRANEXAMIC ACID-NACL 1000-0.7 MG/100ML-% IV SOLN
1000.0000 mg | Freq: Once | INTRAVENOUS | Status: AC
  Administered 2022-08-01: 1000 mg via INTRAVENOUS
  Filled 2022-08-01: qty 100

## 2022-08-01 MED ORDER — ASPIRIN 325 MG PO TBEC
325.0000 mg | DELAYED_RELEASE_TABLET | Freq: Every day | ORAL | Status: DC
  Administered 2022-08-02: 325 mg via ORAL
  Filled 2022-08-01: qty 1

## 2022-08-01 MED ORDER — PREGABALIN 50 MG PO CAPS
50.0000 mg | ORAL_CAPSULE | Freq: Once | ORAL | Status: AC
  Administered 2022-08-01: 50 mg via ORAL
  Filled 2022-08-01: qty 1

## 2022-08-01 MED ORDER — METOCLOPRAMIDE HCL 10 MG PO TABS: 5.0000 mg | ORAL_TABLET | Freq: Three times a day (TID) | ORAL | Status: DC | PRN

## 2022-08-01 MED ORDER — SODIUM CHLORIDE 0.9 % IV SOLN: INTRAVENOUS | Status: AC

## 2022-08-01 MED ORDER — PROPOFOL 500 MG/50ML IV EMUL
INTRAVENOUS | Status: DC | PRN
  Administered 2022-08-01: 50 ug/kg/min via INTRAVENOUS

## 2022-08-01 MED ORDER — OXYCODONE HCL 5 MG PO TABS: 5.0000 mg | ORAL_TABLET | ORAL | Status: DC | PRN

## 2022-08-01 MED ORDER — SODIUM CHLORIDE 0.9 % IR SOLN
Status: DC | PRN
  Administered 2022-08-01: 3000 mL

## 2022-08-01 MED ORDER — ACETAMINOPHEN 500 MG PO TABS
1000.0000 mg | ORAL_TABLET | Freq: Four times a day (QID) | ORAL | Status: DC
  Administered 2022-08-01 – 2022-08-02 (×3): 1000 mg via ORAL
  Filled 2022-08-01 (×3): qty 2

## 2022-08-01 MED ORDER — ONDANSETRON HCL 4 MG/2ML IJ SOLN
4.0000 mg | Freq: Once | INTRAMUSCULAR | Status: AC
  Administered 2022-08-01: 4 mg via INTRAVENOUS
  Filled 2022-08-01: qty 2

## 2022-08-01 MED ORDER — METHOCARBAMOL 1000 MG/10ML IJ SOLN
500.0000 mg | Freq: Once | INTRAVENOUS | Status: AC
  Administered 2022-08-01: 500 mg via INTRAVENOUS
  Filled 2022-08-01: qty 5

## 2022-08-01 MED ORDER — MENTHOL 3 MG MT LOZG: 1.0000 | LOZENGE | OROMUCOSAL | Status: DC | PRN

## 2022-08-01 MED ORDER — POVIDONE-IODINE 10 % EX SWAB: 2.0000 | Freq: Once | CUTANEOUS | Status: DC

## 2022-08-01 MED ORDER — CEFAZOLIN SODIUM-DEXTROSE 2-4 GM/100ML-% IV SOLN
2.0000 g | INTRAVENOUS | Status: AC
  Administered 2022-08-01: 2 g via INTRAVENOUS
  Filled 2022-08-01: qty 100

## 2022-08-01 MED ORDER — ONDANSETRON HCL 4 MG/2ML IJ SOLN: 4.0000 mg | Freq: Four times a day (QID) | INTRAMUSCULAR | Status: DC | PRN

## 2022-08-01 MED ORDER — ALUM & MAG HYDROXIDE-SIMETH 200-200-20 MG/5ML PO SUSP: 30.0000 mL | ORAL | Status: DC | PRN

## 2022-08-01 MED ORDER — MIDAZOLAM HCL 2 MG/2ML IJ SOLN
INTRAMUSCULAR | Status: DC | PRN
  Administered 2022-08-01: 2 mg via INTRAVENOUS

## 2022-08-01 MED ORDER — BUPIVACAINE-MELOXICAM ER 200-6 MG/7ML IJ SOLN
INTRAMUSCULAR | Status: DC | PRN
  Administered 2022-08-01: 400 mg

## 2022-08-01 MED ORDER — ATORVASTATIN CALCIUM 10 MG PO TABS
10.0000 mg | ORAL_TABLET | Freq: Every day | ORAL | Status: DC
  Administered 2022-08-01: 10 mg via ORAL
  Filled 2022-08-01: qty 1

## 2022-08-01 MED ORDER — ONDANSETRON HCL 4 MG PO TABS: 4.0000 mg | ORAL_TABLET | Freq: Four times a day (QID) | ORAL | Status: DC | PRN

## 2022-08-01 SURGICAL SUPPLY — 60 items
ATTUNE PSFEM RTSZ5 NARCEM KNEE (Femur) IMPLANT
BANDAGE ESMARK 6X9 LF (GAUZE/BANDAGES/DRESSINGS) ×2 IMPLANT
BASEPLATE TIB CMT FB PCKT SZ4 (Stem) IMPLANT
BLADE SAGITTAL 25.0X1.27X90 (BLADE) ×2 IMPLANT
BLADE SAW SGTL 11.0X1.19X90.0M (BLADE) ×2 IMPLANT
BLADE SURG SZ10 CARB STEEL (BLADE) ×2 IMPLANT
BNDG CMPR 9X6 STRL LF SNTH (GAUZE/BANDAGES/DRESSINGS) ×1
BNDG ESMARK 6X9 LF (GAUZE/BANDAGES/DRESSINGS) ×1
BSPLAT TIB 4 CMNT FX BRNG STRL (Stem) ×1 IMPLANT
CEMENT HV SMART SET (Cement) ×4 IMPLANT
CLOTH BEACON ORANGE TIMEOUT ST (SAFETY) ×2 IMPLANT
COOLER ICEMAN CLASSIC (MISCELLANEOUS) ×2 IMPLANT
COVER LIGHT HANDLE STERIS (MISCELLANEOUS) ×4 IMPLANT
CUFF TOURN SGL QUICK 34 (TOURNIQUET CUFF) ×1
CUFF TRNQT CYL 34X4.125X (TOURNIQUET CUFF) ×2 IMPLANT
DRAPE BACK TABLE (DRAPES) ×2 IMPLANT
DRAPE EXTREMITY T 121X128X90 (DISPOSABLE) ×2 IMPLANT
DRESSING AQUACEL AG ADV 3.5X12 (MISCELLANEOUS) ×2 IMPLANT
DRSG AQUACEL AG ADV 3.5X12 (MISCELLANEOUS) ×1
DURAPREP 26ML APPLICATOR (WOUND CARE) ×4 IMPLANT
ELECT REM PT RETURN 9FT ADLT (ELECTROSURGICAL) ×1
ELECTRODE REM PT RTRN 9FT ADLT (ELECTROSURGICAL) ×2 IMPLANT
GLOVE BIOGEL PI IND STRL 6 (GLOVE) IMPLANT
GLOVE BIOGEL PI IND STRL 6.5 (GLOVE) IMPLANT
GLOVE BIOGEL PI IND STRL 7.0 (GLOVE) ×6 IMPLANT
GLOVE BIOGEL PI IND STRL 8.5 (GLOVE) ×2 IMPLANT
GLOVE ECLIPSE 6.5 STRL STRAW (GLOVE) IMPLANT
GLOVE SKINSENSE STRL SZ8.0 LF (GLOVE) ×2 IMPLANT
GOWN STRL REUS W/TWL LRG LVL3 (GOWN DISPOSABLE) ×6 IMPLANT
GOWN STRL REUS W/TWL XL LVL3 (GOWN DISPOSABLE) ×2 IMPLANT
HANDPIECE INTERPULSE COAX TIP (DISPOSABLE) ×1
HOOD W/PEELAWAY (MISCELLANEOUS) ×8 IMPLANT
INSERT TIB ATTUNE FB SZ5X8 (Insert) IMPLANT
INST SET MAJOR BONE (KITS) ×2 IMPLANT
IV NS IRRIG 3000ML ARTHROMATIC (IV SOLUTION) ×2 IMPLANT
KIT BLADEGUARD II DBL (SET/KITS/TRAYS/PACK) ×2 IMPLANT
KIT TURNOVER KIT A (KITS) ×2 IMPLANT
MANIFOLD NEPTUNE II (INSTRUMENTS) ×2 IMPLANT
MARKER SKIN DUAL TIP RULER LAB (MISCELLANEOUS) ×2 IMPLANT
NS IRRIG 1000ML POUR BTL (IV SOLUTION) ×2 IMPLANT
PACK TOTAL JOINT (CUSTOM PROCEDURE TRAY) ×2 IMPLANT
PAD ARMBOARD 7.5X6 YLW CONV (MISCELLANEOUS) ×2 IMPLANT
PAD COLD SHLDR SM WRAP-ON (PAD) ×2 IMPLANT
PATELLA MEDIAL ATTUN 35MM KNEE (Knees) IMPLANT
PILLOW KNEE EXTENSION 0 DEG (MISCELLANEOUS) ×2 IMPLANT
PIN/DRILL PACK ORTHO 1/8X3.0 (PIN) IMPLANT
SAW OSC TIP CART 19.5X105X1.3 (SAW) ×2 IMPLANT
SET BASIN LINEN APH (SET/KITS/TRAYS/PACK) ×2 IMPLANT
SET HNDPC FAN SPRY TIP SCT (DISPOSABLE) ×2 IMPLANT
SOLUTION IRRIG SURGIPHOR (IV SOLUTION) ×2 IMPLANT
STAPLER VISISTAT 35W (STAPLE) ×2 IMPLANT
SUT BRALON NAB BRD #1 30IN (SUTURE) ×2 IMPLANT
SUT MNCRL 0 VIOLET CTX 36 (SUTURE) ×2 IMPLANT
SUT MON AB 0 CT1 (SUTURE) ×2 IMPLANT
SYR BULB IRRIG 60ML STRL (SYRINGE) ×2 IMPLANT
TOWEL OR 17X26 4PK STRL BLUE (TOWEL DISPOSABLE) ×2 IMPLANT
TOWER CARTRIDGE SMART MIX (DISPOSABLE) ×2 IMPLANT
TRAY FOLEY MTR SLVR 16FR STAT (SET/KITS/TRAYS/PACK) ×2 IMPLANT
WATER STERILE IRR 1000ML POUR (IV SOLUTION) ×4 IMPLANT
YANKAUER SUCT 12FT TUBE ARGYLE (SUCTIONS) ×2 IMPLANT

## 2022-08-01 NOTE — Plan of Care (Signed)
  Problem: Acute Rehab PT Goals(only PT should resolve) Goal: Pt Will Go Supine/Side To Sit Outcome: Progressing Flowsheets (Taken 08/01/2022 1518) Pt will go Supine/Side to Sit:  with modified independence  Independently Goal: Patient Will Transfer Sit To/From Stand Outcome: Progressing Flowsheets (Taken 08/01/2022 1518) Patient will transfer sit to/from stand:  with modified independence  with supervision Goal: Pt Will Transfer Bed To Chair/Chair To Bed Outcome: Progressing Flowsheets (Taken 08/01/2022 1518) Pt will Transfer Bed to Chair/Chair to Bed:  with modified independence  with supervision Goal: Pt Will Ambulate Outcome: Progressing Flowsheets (Taken 08/01/2022 1518) Pt will Ambulate:  100 feet  with modified independence  with supervision  with rolling walker   3:19 PM, 08/01/22 Ocie Bob, MPT Physical Therapist with [facility] [phone] office [phone] mobile phone

## 2022-08-01 NOTE — Evaluation (Signed)
Physical Therapy Evaluation Patient Details Name: Maria Morrison MRN: [id number] DOB: 1945/12/08 Today's Date: 08/01/2022   RIGHT KNEE ROM: 0 - 100 degrees AMBULATION DISTANCE:  45 feet using RW with Min/guard to Min assist   History of Present Illness  Maria Morrison is a 77 y/o female, s/p Right TKA on 08/01/22, with the diagnosis of right knee osteoarthritis  Clinical Impression  Patient instructed in HEP with good carryover demonstrated, tolerated walking in room/hallway with slow labored cadence, fair return for right heel to toe stepping and limited for gait training due to fatigue and mild nausea - nurse notified for nausea medication.  Patient tolerated sitting up in chair after therapy.  Patient will benefit from continued skilled physical therapy in hospital and recommended venue below to increase strength, balance, endurance for safe ADLs and gait.       Recommendations for follow up therapy are one component of a multi-disciplinary discharge planning process, led by the attending physician.  Recommendations may be updated based on patient status, additional functional criteria and insurance authorization.  Follow Up Recommendations       Assistance Recommended at Discharge Set up Supervision/Assistance  Patient can return home with the following  A little help with walking and/or transfers;A little help with bathing/dressing/bathroom;Help with stairs or ramp for entrance;Assistance with cooking/housework    Equipment Recommendations Rolling walker (2 wheels)  Recommendations for Other Services       Functional Status Assessment Patient has had a recent decline in their functional status and demonstrates the ability to make significant improvements in function in a reasonable and predictable amount of time.     Precautions / Restrictions Precautions Precautions: Fall Restrictions Weight Bearing Restrictions: Yes RLE Weight Bearing: Weight bearing as tolerated       Mobility  Bed Mobility Overal bed mobility: Needs Assistance Bed Mobility: Supine to Sit     Supine to sit: Min guard, Min assist     General bed mobility comments: increased time, labored movement    Transfers Overall transfer level: Needs assistance Equipment used: Rolling walker (2 wheels) Transfers: Sit to/from Stand, Bed to chair/wheelchair/BSC Sit to Stand: Min guard, Min assist   Step pivot transfers: Min guard, Min assist       General transfer comment: slightly labored movement with most difficulty taking steps backwards during transfer to chair using RW    Ambulation/Gait Ambulation/Gait assistance: Min guard, Min assist Gait Distance (Feet): 45 Feet Assistive device: Rolling walker (2 wheels) Gait Pattern/deviations: Decreased step length - right, Decreased step length - left, Decreased stance time - right, Decreased stride length, Antalgic, Trunk flexed Gait velocity: decreased     General Gait Details: slow labored unsteady cadence with fair return for right heel to toe stepping without loss of balance, limited mostly due to c/o fatigue and mild nausea  Stairs            Wheelchair Mobility    Modified Rankin (Stroke Patients Only)       Balance Overall balance assessment: Needs assistance Sitting-balance support: Feet supported, No upper extremity supported Sitting balance-Leahy Scale: Good Sitting balance - Comments: seated at EOB   Standing balance support: During functional activity, Bilateral upper extremity supported Standing balance-Leahy Scale: Fair Standing balance comment: using RW                             Pertinent Vitals/Pain Pain Assessment Pain Assessment: Faces Faces Pain Scale: Hurts  little more Pain Location: right knee Pain Descriptors / Indicators: Dull, Grimacing, Sore Pain Intervention(s): Limited activity within patient's tolerance, Monitored during session, Repositioned    Home Living  Family/patient expects to be discharged to:: Private residence Living Arrangements: Spouse/significant other Available Help at Discharge: Family;Available 24 hours/day Type of Home: House Home Access: Stairs to enter Entrance Stairs-Rails: Right;Left;Can reach both Entrance Stairs-Number of Steps: 3   Home Layout: One level Home Equipment: Shower seat - built in;Cane - single point      Prior Function Prior Level of Function : Independent/Modified Independent;Driving             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, drives ADLs Comments: Independent     Hand Dominance   Dominant Hand: Right    Extremity/Trunk Assessment   Upper Extremity Assessment Upper Extremity Assessment: Overall WFL for tasks assessed    Lower Extremity Assessment Lower Extremity Assessment: RLE deficits/detail RLE Deficits / Details: grossly 4/5 RLE: Unable to fully assess due to pain RLE Sensation: WNL RLE Coordination: WNL    Cervical / Trunk Assessment Cervical / Trunk Assessment: Normal  Communication   Communication: No difficulties  Cognition Arousal/Alertness: Awake/alert Behavior During Therapy: WFL for tasks assessed/performed Overall Cognitive Status: Within Functional Limits for tasks assessed                                          General Comments      Exercises Total Joint Exercises Ankle Circles/Pumps: 10 reps, Right, AROM, Supine, Strengthening Quad Sets: AROM, Right, 10 reps, Supine, Strengthening Short Arc Quad: AROM, Strengthening, Right, 10 reps, Supine Heel Slides: AROM, Strengthening, Right, 10 reps, Supine Goniometric ROM: Right knee: 0 - 100 degrees   Assessment/Plan    PT Assessment Patient needs continued PT services  PT Problem List Decreased strength;Decreased activity tolerance;Decreased balance;Decreased mobility;Decreased range of motion       PT Treatment Interventions DME instruction;Gait training;Stair  training;Functional mobility training;Therapeutic activities;Therapeutic exercise;Patient/family education;Balance training    PT Goals (Current goals can be found in the Care Plan section)  Acute Rehab PT Goals Patient Stated Goal: return home with family to assist PT Goal Formulation: With patient Time For Goal Achievement: 08/03/22 Potential to Achieve Goals: Good    Frequency BID     Co-evaluation               AM-PAC PT "6 Clicks" Mobility  Outcome Measure Help needed turning from your back to your side while in a flat bed without using bedrails?: None Help needed moving from lying on your back to sitting on the side of a flat bed without using bedrails?: A Little Help needed moving to and from a bed to a chair (including a wheelchair)?: A Little Help needed standing up from a chair using your arms (e.g., wheelchair or bedside chair)?: A Little Help needed to walk in hospital room?: A Little Help needed climbing 3-5 steps with a railing? : A Lot 6 Click Score: 18    End of Session   Activity Tolerance: Patient tolerated treatment well;Patient limited by fatigue;Patient limited by pain Patient left: in chair;with call bell/phone within reach Nurse Communication: Mobility status PT Visit Diagnosis: Unsteadiness on feet (R26.81);Other abnormalities of gait and mobility (R26.89);Muscle weakness (generalized) (M62.81)    Time: 1400-1431 PT Time Calculation (min) (ACUTE ONLY): 31 min   Charges:   PT Evaluation $  PT Eval Moderate Complexity: 1 Mod PT Treatments $Therapeutic Activity: 23-37 mins        3:16 PM, 08/01/22 Ocie Bob, MPT Physical Therapist with [facility] 336 [phone] office [phone] mobile phone

## 2022-08-01 NOTE — Anesthesia Procedure Notes (Signed)
Anesthesia Regional Block: Adductor canal block   Pre-Anesthetic Checklist: , timeout performed,  Correct Patient, Correct Site, Correct Laterality,  Correct Procedure, Correct Position, site marked,  Risks and benefits discussed,  Surgical consent,  Pre-op evaluation,  At surgeon's request and post-op pain management  Laterality: Right  Prep: chloraprep       Needles:  Injection technique: Single-shot  Needle Type: Stimiplex     Needle Length: 10cm  Needle Gauge: 22   Needle insertion depth: 8 cm   Additional Needles:   Procedures:,,,, ultrasound used (permanent image in chart),,    Narrative:  Start time: 08/01/2022 10:20 AM End time: 08/01/2022 10:25 AM Injection made incrementally with aspirations every 5 mL.  Performed by: Personally  CRNA: Izola Price., CRNA

## 2022-08-01 NOTE — Transfer of Care (Signed)
Immediate Anesthesia Transfer of Care Note  Patient: Maria Morrison  Procedure(s) Performed: TOTAL KNEE ARTHROPLASTY (Right: Knee)  Patient Location: PACU  Anesthesia Type:Spinal and GA combined with regional for post-op pain  Level of Consciousness: awake, alert , and oriented  Airway & Oxygen Therapy: Patient Spontanous Breathing and Patient connected to face mask oxygen  Post-op Assessment: Report given to RN and Post -op Vital signs reviewed and stable  Post vital signs: Reviewed and stable  Last Vitals:  Vitals Value Taken Time  BP 103/54 08/01/22 1015  Temp 36.4 C 08/01/22 1013  Pulse 74 08/01/22 1026  Resp 19 08/01/22 1026  SpO2 97 % 08/01/22 1026  Vitals shown include unvalidated device data.  Last Pain:  Vitals:   08/01/22 1013  TempSrc:   PainSc: 0-No pain         Complications: No notable events documented.

## 2022-08-01 NOTE — Op Note (Signed)
Dictation for total knee replacement  08/01/2022  10:15 AM  Orthopaedic Surgery Operative Note (CSN: [id number])  Maria Morrison  [birth year] Date of Surgery: 08/01/2022   Diagnoses:  right knee osteoarthritis  Procedure: Right Total knee arthroplasty   Operative Finding Varus alignment to the knee about 10 degrees, grade 4 wear on the medial side, medial osteophytes, minimal patellofemoral chondromalacia, normal lateral compartment   Post-Op Diagnosis: Same Surgeons:Primary: Vickki Hearing, MD Assistants: Romie Levee Location: [facility] OR ROOM 4 Anesthesia: Spinal plus postop saphenous nerve block Antibiotics: Ancef 2 g Tourniquet time:  Total Tourniquet Time Documented: Thigh (Right) - 114 minutes Total: Thigh (Right) - 114 minutes Tourniquet pressure 300 mm  Estimated Blood Loss: Normal Complications: None Specimens: None   TXA  YES  Implants: Implant Name Type Inv. Item Serial No. Manufacturer Lot No. LRB No. Used Action  CEMENT HV SMART SET - [id number] Cement CEMENT HV SMART SET  [employer] [id number] Right 1 Implanted  CEMENT HV SMART SET - [id number] Cement CEMENT HV SMART SET  [employer] [id number] Right 1 Implanted  INSERT TIB ATTUNE FB SZ5X8 - [id number] Insert INSERT TIB ATTUNE FB SZ5X8  [employer]PU[employer] [id number] Right 1 Implanted  PATELLA MEDIAL ATTUN KNEE - [id number] Knees PATELLA MEDIAL ATTUN KNEE  [employer]PU[employer] [id number] Right 1 Implanted  ATTUNE PSFEM RTSZ5 NARCEM KNEE - [id number] Femur ATTUNE PSFEM RTSZ5 NARCEM KNEE  [employer] [id number] Right 1 Implanted  BASEPLATE TIB CMT FB PCKT SZ4 - [id number] Stem BASEPLATE TIB CMT FB PCKT SZ4  [employer] [id number] Right 1 Implanted     Indications for Surgery:   Maria Morrison is a 77 y.o. female presented with disabling knee pain from osteoarthritis.  The patient had previously been treated conservatively and the conservative treatment failed and she opted for right  total knee arthroplasty.  A preop discussion was completed which included risks and benefits of surgery.  Procedure:   -details of surgery: The patient was identified by 2 approved identification mechanisms. The operative extremity was evaluated and found to be acceptable for surgical treatment today. The chart was reviewed. The surgical site was confirmed and marked. The patient had a saphenous nerve block postoperatively  The patient was taken to the operating room and given appropriate antibiotic 2 g Ancef. This is consistent with the SCIP protocol.  The patient was given the following anesthetic: Spinal  The patient was then placed supine on the operating table. A Foley catheter was inserted. The operative extremity was prepped and draped sterilely from the toes to the groin.  Timeout was executed confirming the patient's name, surgical site, antibiotic administration, x-rays available, and implants available.  The operative limb,  was exsanguinated with a six-inch Esmarch and the tourniquet was inflated to 300 mmHg.  A straight midline incision was made over the right KNEE and taken down to the extensor mechanism. A medial arthrotomy was performed. The patella was everted and the patellofemoral soft tissue was released, along with the patellar fat pad.  The anterior cruciate ligament and PCL were resected.  The anterior horns of the lateral and medial meniscus were resected. The medial soft tissue sleeve was elevated to the mid coronal plane.  A three-eighths inch drill bit was used to enter the femoral canal which was decompressed with suction and irrigation until clear.   The distal femoral cutting guide was set for 9 mm distal resection,  5valgus alignment, for a right knee. The distal femur was resected and  checked for flatness.  The attune sizing femoral guide was placed and the femur was preliminarily sized to a size 5.   The external alignment guide for the tibial resection  was then applied to the distal and proximal tibia and set for 3 degrees posterior slope along with 3 MM resection  from the medial.   Rotational alignment was set using the malleolus, the tibial tubercle and the tibial spines.  The proximal tibia was resected along with  residual menisci. The tibia was sized using a base plate to a size 4.   The extension gap was checked.  We sized up to an 8 and additional medial release was required.   A 4-in-1 cutting block was placed along with collateral ligament retractors.  Our target was proper rotation based on the epicondylar axis using Whitesides line as a guide as well.  Once the block was pinned in place we took the spacer block that balanced extension gap size 8 and placed it on top of the tibia under the femoral block.  This fit well.  Once I was satisfied with the spacer block collateral ligament retractors were placed and I completed the 4 distal femoral cuts   The extension gap was rechecked with the same spacer block which was a size 8   The correct sized notch cutting guide for the femur was then applied and the notch cut was made.  Trial reduction was completed using size 5 femur, 4 tibia, 8 polyethylene insert trial implants. Patella tracking was normal  We then skeletonized the patella. It measured 22 1.5 in thickness and the patellar resection was set for 9.5 mm patellar resection.  The guide came off and I had to finish it freehand.  The patella diameter measured 35. We then drilled the peg holes for the patella.    The proximal tibia was prepared using the size 4 base plate.  Thorough irrigation was performed using saline  and the bone was dried and prepared for cement. The cement was mixed on the back table using third generation preparation techniques  The implants were then cemented in place and excess cement was removed. The cement was allowed to cure. Surgipor irrigation was placed followed by saline irrigation  Any excess  bone fragments and cement was removed.  The extensor mechanism was closed with #1 Bralon suture followed by subcutaneous tissue closure using 0 Monocryl in interrupted fashion. (Zynrelef a total of 2 vials were injected prior to complete extensor mechanism closure.  The openings and the capsule were then closed with #1 Braylon)   Skin approximation was performed using staples  A sterile dressing was applied, TED hose were placed on the operative extremity followed by Cryo/Cuff.  The patient was taken recovery room in stable condition  Postop plan: Weightbearing as tolerated CPM machine Immediate physical therapy Discharge tomorrow if stable Aspirin for DVT prophylaxis

## 2022-08-01 NOTE — Anesthesia Preprocedure Evaluation (Signed)
Anesthesia Evaluation  Patient identified by MRN, date of birth, ID band Patient awake    Reviewed: Allergy & Precautions, H&P , NPO status , Patient's Chart, lab work & pertinent test results, reviewed documented beta blocker date and time   Airway Mallampati: II  TM Distance: >3 FB Neck ROM: full    Dental no notable dental hx.    Pulmonary neg pulmonary ROS, sleep apnea , former smoker   Pulmonary exam normal breath sounds clear to auscultation       Cardiovascular Exercise Tolerance: Good hypertension, negative cardio ROS  Rhythm:regular Rate:Normal     Neuro/Psych negative neurological ROS  negative psych ROS   GI/Hepatic negative GI ROS, Neg liver ROS,GERD  ,,  Endo/Other  negative endocrine ROSdiabetes    Renal/GU negative Renal ROS  negative genitourinary   Musculoskeletal   Abdominal   Peds  Hematology negative hematology ROS (+)   Anesthesia Other Findings   Reproductive/Obstetrics negative OB ROS                             Anesthesia Physical Anesthesia Plan  ASA: 2  Anesthesia Plan: General   Post-op Pain Management: Regional block*   Induction:   PONV Risk Score and Plan: Ondansetron  Airway Management Planned:   Additional Equipment:   Intra-op Plan:   Post-operative Plan:   Informed Consent: I have reviewed the patients History and Physical, chart, labs and discussed the procedure including the risks, benefits and alternatives for the proposed anesthesia with the patient or authorized representative who has indicated his/her understanding and acceptance.     Dental Advisory Given  Plan Discussed with: CRNA  Anesthesia Plan Comments:        Anesthesia Quick Evaluation

## 2022-08-01 NOTE — Interval H&P Note (Signed)
History and Physical Interval Note:  08/01/2022 7:26 AM  Maria Morrison  has presented today for surgery, with the diagnosis of right knee osteoarthritis.  The various methods of treatment have been discussed with the patient and family. After consideration of risks, benefits and other options for treatment, the patient has consented to  Procedure(s): TOTAL KNEE ARTHROPLASTY (Right) as a surgical intervention.  The patient's history has been reviewed, patient examined, no change in status, stable for surgery.  I have reviewed the patient's chart and labs.  Questions were answered to the patient's satisfaction.     Fuller Canada

## 2022-08-01 NOTE — Brief Op Note (Signed)
08/01/2022  10:08 AM  PATIENT:  Maria Morrison  77 y.o. female  PRE-OPERATIVE DIAGNOSIS:  right knee osteoarthritis  POST-OPERATIVE DIAGNOSIS:  right knee osteoarthritis  PROCEDURE:  Procedure(s): TOTAL KNEE ARTHROPLASTY (Right)  SURGEON:  Surgeon(s) and Role:    Vickki Hearing, MD - Primary  PHYSICIAN ASSISTANT:   ASSISTANTS: nicki (harley) cox   ANESTHESIA:   spinal  EBL:  minimal   BLOOD ADMINISTERED:none  DRAINS: none   LOCAL MEDICATIONS USED:  OTHER zinrelef  SPECIMEN:  No Specimen  DISPOSITION OF SPECIMEN:  N/A  COUNTS:  YES  TOURNIQUET:   Total Tourniquet Time Documented: Thigh (Right) - 114 minutes Total: Thigh (Right) - 114 minutes   DICTATION: .Reubin Milan Dictation  PLAN OF CARE: Admit for overnight observation  PATIENT DISPOSITION:  PACU - hemodynamically stable.   Delay start of Pharmacological VTE agent (>24hrs) due to surgical blood loss or risk of bleeding: yes

## 2022-08-01 NOTE — H&P (Signed)
History and Physical for Surgery               Patient: Maria Morrison                                        Date of Birth: August 23, 1945                                                   MRN: [id number] Visit Date: 07/20/2022 Requested by: Avis Epley, PA-C [address] [place],  [place] [address] PCP: Assunta Found, MD     Assessment & Plan: The patient is agreeable after discussion of risks and benefits of surgery to right total knee arthroplasty   She has 3 steps to get into her house she has bathroom and a place to sleep on the main floor   The procedure has been fully reviewed with the patient; The risks and benefits of surgery have been discussed and explained and understood. Alternative treatment has also been reviewed, questions were encouraged and answered. The postoperative plan is also been reviewed.   We reviewed the increased risks of surgery with diabetics in patients with varicose veins which include increased risk of infection and increased risk of blood clot   We also reminded her that her right leg would get longer after surgery      Subjective:     Chief Complaint  Patient presents with   Knee Pain      Right       HPI: 77 year old female with longstanding osteoarthritis of the right knee but symptomatic only for 1 year.  She describes a vague history of avascular necrosis of the femur but does not reported being in the hip or knee?   She has disabling right knee pain at this time she is using a cane she had an injection it did not help she tried some over-the-counter medications again without relief   She is diabetic she does have varicose veins   Her hemoglobin A1c was 6                                                                         ROS: No fever chest pain or shortness of breath all other systems were reviewed and were negative     Images personally read and my interpretation : Plain films show varus deformity osteoarthritis  grade 4 she does have some osteophytes that need to be addressed   Visit Diagnoses: Osteoarthrtitis right knee          Objective: Vital Signs: BP (!) 146/68   Pulse 71   Ht 5\' 3"  (1.6 m)   Wt 197 lb (89.4 kg)   BMI 34.90 kg/m    Physical Exam Vitals and nursing note reviewed.  Constitutional:      Appearance: Normal appearance.  HENT:     Head: Normocephalic and atraumatic.  Eyes:     General: No scleral icterus.       Right  eye: No discharge.        Left eye: No discharge.     Extraocular Movements: Extraocular movements intact.     Conjunctiva/sclera: Conjunctivae normal.     Pupils: Pupils are equal, round, and reactive to light.  Cardiovascular:     Rate and Rhythm: Normal rate.     Pulses: Normal pulses.  Musculoskeletal:     Right knee: Effusion present.  Skin:    General: Skin is warm and dry.     Capillary Refill: Capillary refill takes less than 2 seconds.  Neurological:     General: No focal deficit present.     Mental Status: She is alert and oriented to person, place, and time.  Psychiatric:        Mood and Affect: Mood normal.        Behavior: Behavior normal.        Thought Content: Thought content normal.        Judgment: Judgment normal.          Right Knee Exam    Tenderness  The patient is experiencing tenderness in the medial joint line and patella.   Range of Motion  Extension:  5  Right knee flexion: 108 by goniometer.    Tests  Varus: negative Valgus: negative Drawer:  Anterior - negative    Posterior - negative   Other  Erythema: absent Scars: absent Sensation: normal Pulse: present Swelling: none Effusion: effusion present   Comments:  Varicose veins, no edema   Circumference of the thigh large     Left Knee Exam    Comments:  Varicose veins, no edema             Specialty Comments:  No specialty comments available.   Imaging: No results found.     PMFS History:     Patient Active Problem List     Diagnosis Date Noted   Gastroesophageal reflux disease 04/28/2020   Rectal bleeding 09/24/2019   Constipation 09/24/2019   Rectal pain 09/24/2019   History of colonic polyps          Past Medical History:  Diagnosis Date   Diabetes mellitus without complication (HCC)     GERD (gastroesophageal reflux disease)     HLD (hyperlipidemia)     HTN (hypertension)           Family History  Problem Relation Age of Onset   Hypertension Mother     Stroke Mother     Hypertension Father     Diabetes Father     Parkinson's disease Father     Stroke Maternal Grandfather     Stroke Paternal Grandmother     Stroke Paternal Grandfather     Colon cancer Neg Hx           Past Surgical History:  Procedure Laterality Date   APPENDECTOMY       BACK SURGERY       COLONOSCOPY N/A 02/25/2014    Procedure: COLONOSCOPY;  Surgeon: Corbin Ade, MD;  two tubular adenomas, anal papilla, internal hemorrhoids.  Recommended repeat colonoscopy in 5 years.   COLONOSCOPY N/A 06/16/2020    Surgeon: Corbin Ade, MD;  entirely normal exam.  No recommendations to repeat due to age.   INJECTION KNEE Right 04/2022   TUBAL LIGATION        Social History         Occupational History      Comment: retired  Tobacco Use  Smoking status: Former   Smokeless tobacco: Never  Substance and Sexual Activity   Alcohol use: No      Alcohol/week: 0.0 standard drinks of alcohol   Drug use: No   Sexual activity: Not on file

## 2022-08-01 NOTE — Anesthesia Procedure Notes (Signed)
Spinal  Patient location during procedure: OR Start time: 08/01/2022 7:35 AM End time: 08/01/2022 7:41 AM Reason for block: surgical anesthesia Staffing Performed: resident/CRNA  Resident/CRNA: Izola Price., CRNA Performed by: Izola Price., CRNA Authorized by: Windell Norfolk, MD   Preanesthetic Checklist Completed: patient identified, IV checked, site marked, risks and benefits discussed, surgical consent, monitors and equipment checked, pre-op evaluation and timeout performed Spinal Block Patient position: sitting Prep: Betadine Patient monitoring: heart rate, cardiac monitor, continuous pulse ox and blood pressure Approach: midline Location: L3-4 Injection technique: single-shot Needle Needle type: Pencan  Needle gauge: 25 G Needle length: 10 cm Needle insertion depth: 6 cm Assessment Sensory level: T10

## 2022-08-01 NOTE — Anesthesia Postprocedure Evaluation (Signed)
Anesthesia Post Note  Patient: Maria Morrison  Procedure(s) Performed: TOTAL KNEE ARTHROPLASTY (Right: Knee)  Patient location during evaluation: Phase II Anesthesia Type: General Level of consciousness: awake Pain management: pain level controlled Vital Signs Assessment: post-procedure vital signs reviewed and stable Respiratory status: spontaneous breathing and respiratory function stable Cardiovascular status: blood pressure returned to baseline and stable Postop Assessment: no headache and no apparent nausea or vomiting Anesthetic complications: no Comments: Late entry   No notable events documented.   Last Vitals:  Vitals:   08/01/22 1115 08/01/22 1138  BP: 107/79 (!) 134/97  Pulse: 69 72  Resp: 13   Temp: (!) 36.4 C 36.4 C  SpO2: 90% 98%    Last Pain:  Vitals:   08/01/22 1138  TempSrc: Axillary  PainSc:                  Windell Norfolk

## 2022-08-02 ENCOUNTER — Telehealth: Payer: Self-pay | Admitting: Orthopedic Surgery

## 2022-08-02 DIAGNOSIS — Z87891 Personal history of nicotine dependence: Secondary | ICD-10-CM | POA: Diagnosis not present

## 2022-08-02 DIAGNOSIS — Z79899 Other long term (current) drug therapy: Secondary | ICD-10-CM | POA: Diagnosis not present

## 2022-08-02 DIAGNOSIS — M1711 Unilateral primary osteoarthritis, right knee: Secondary | ICD-10-CM | POA: Diagnosis not present

## 2022-08-02 DIAGNOSIS — I10 Essential (primary) hypertension: Secondary | ICD-10-CM | POA: Diagnosis not present

## 2022-08-02 DIAGNOSIS — E119 Type 2 diabetes mellitus without complications: Secondary | ICD-10-CM | POA: Diagnosis not present

## 2022-08-02 LAB — CBC
HCT: 31.7 % — ABNORMAL LOW (ref 36.0–46.0)
Hemoglobin: 9.9 g/dL — ABNORMAL LOW (ref 12.0–15.0)
MCH: 32.2 pg (ref 26.0–34.0)
MCHC: 31.2 g/dL (ref 30.0–36.0)
MCV: 103.3 fL — ABNORMAL HIGH (ref 80.0–100.0)
Platelets: 178 10*3/uL (ref 150–400)
RBC: 3.07 MIL/uL — ABNORMAL LOW (ref 3.87–5.11)
RDW: 12.8 % (ref 11.5–15.5)
WBC: 9.5 10*3/uL (ref 4.0–10.5)
nRBC: 0 % (ref 0.0–0.2)

## 2022-08-02 LAB — BASIC METABOLIC PANEL
Anion gap: 11 (ref 5–15)
BUN: 19 mg/dL (ref 8–23)
CO2: 19 mmol/L — ABNORMAL LOW (ref 22–32)
Calcium: 9.3 mg/dL (ref 8.9–10.3)
Chloride: 105 mmol/L (ref 98–111)
Creatinine, Ser: 0.77 mg/dL (ref 0.44–1.00)
GFR, Estimated: >60 mL/min (ref >60)
Glucose, Bld: 117 mg/dL — ABNORMAL HIGH (ref 70–99)
Potassium: 4.1 mmol/L (ref 3.5–5.1)
Sodium: 135 mmol/L (ref 135–145)

## 2022-08-02 LAB — GLUCOSE, CAPILLARY
Glucose-Capillary: 150 mg/dL — ABNORMAL HIGH (ref 70–99)
Glucose-Capillary: 102 mg/dL — ABNORMAL HIGH (ref 70–99)

## 2022-08-02 MED ORDER — OXYCODONE HCL 5 MG PO TABS: 5.0000 mg | ORAL_TABLET | ORAL | 0 refills | Status: AC | PRN

## 2022-08-02 MED ORDER — METHOCARBAMOL 500 MG PO TABS: 500.0000 mg | ORAL_TABLET | Freq: Four times a day (QID) | ORAL | 2 refills | Status: AC | PRN

## 2022-08-02 MED ORDER — ACETAMINOPHEN 500 MG PO TABS: 500.0000 mg | ORAL_TABLET | Freq: Four times a day (QID) | ORAL | 0 refills | Status: AC | PRN

## 2022-08-02 MED ORDER — BISACODYL 5 MG PO TBEC: 5.0000 mg | DELAYED_RELEASE_TABLET | Freq: Every day | ORAL | 0 refills | Status: AC | PRN

## 2022-08-02 MED ORDER — POLYETHYLENE GLYCOL 3350 17 G PO PACK: 17.0000 g | PACK | Freq: Every day | ORAL | 0 refills | Status: AC | PRN

## 2022-08-02 MED ORDER — ASPIRIN 325 MG PO TBEC: 325.0000 mg | DELAYED_RELEASE_TABLET | Freq: Every day | ORAL | 0 refills | Status: AC

## 2022-08-02 MED ORDER — CELECOXIB 200 MG PO CAPS: 200.0000 mg | ORAL_CAPSULE | Freq: Two times a day (BID) | ORAL | 2 refills | Status: AC

## 2022-08-02 NOTE — Telephone Encounter (Signed)
Dr. Mort Sawyers pt - pt's spouse presented to the office and stated that she was just discharged from [facility] and that she is supposed to have a script for a walker and [facility] didn't have anything.

## 2022-08-02 NOTE — TOC Transition Note (Signed)
Transition of Care [facility]) - CM/SW Discharge Note   Patient Details  Name: Maria Morrison MRN: [id number] Date of Birth: 1945/05/10  [facility] CM/SW Contact:  Karn Cassis, LCSW Phone Number: 08/02/2022, 10:25 AM   Clinical Narrative: Anticipate possible d/c later today. PT recommending home health and rolling walker. Discussed with pt who has no preference on agency. Rolling walker referred to [place] with [facility] to drop ship to home. Clifton Custard with [facility] accepts for HHPT. Orders in. No other needs reported.       Final next level of care: Home w Home Health Services Barriers to Discharge: Barriers Resolved   Patient Goals and CMS Choice   Choice offered to / list presented to : Patient  Discharge Placement                         Discharge Plan and Services Additional resources added to the After Visit Summary for                  DME Arranged: Walker rolling DME Agency: [facility] Date DME Agency Contacted: 08/02/22 Time DME Agency Contacted: [phone] Representative spoke with at DME Agency: Barbara Cower HH Arranged: PT HH Agency: [facility] Date [facility] Agency Contacted: 08/02/22 Time HH Agency Contacted: [phone] Representative spoke with at [facility] Agency: Clifton Custard  Social Determinants of Health (SDOH) Interventions SDOH Screenings   Food Insecurity: No Food Insecurity (08/01/2022)  Housing: Low Risk  (08/01/2022)  Transportation Needs: No Transportation Needs (08/01/2022)  Utilities: Not At Risk (08/01/2022)  Tobacco Use: Medium Risk (08/01/2022)     Readmission Risk Interventions     No data to display

## 2022-08-02 NOTE — Progress Notes (Signed)
Physical Therapy Treatment Patient Details Name: Maria Morrison MRN: [id number] DOB: [birth year] Today's Date: 08/02/2022   RIGHT KNEE ROM: 0 - 105 degrees AMBULATION DISTANCE:  100 feet using RW with Supervision/Modified Independence    History of Present Illness ARREON AREND is a 77 y/o female, s/p Right TKA on 08/01/22, with the diagnosis of right knee osteoarthritis    PT Comments    Patient demonstrates fair return for going up/down 4 steps using bilateral side rails, had 1 miss step when taking last step going down at bottom of stairwell with loss of balance having to sit on step without injury or increased knee pain, had patient repeat and able to go up/down steps safely without loss of balance and advised to have family members assisting when having to go up/down steps at home with understanding acknowledged.  Patient also demonstrated increased endurance/distance for gait training with improvement in right heel to toe stepping without loss of balance and achieved increased right knee flexion self flexing seated at bedside.  Patient tolerated sitting up in chair after therapy.  Patient will benefit from continued skilled physical therapy in hospital and recommended venue below to increase strength, balance, endurance for safe ADLs and gait.    Recommendations for follow up therapy are one component of a multi-disciplinary discharge planning process, led by the attending physician.  Recommendations may be updated based on patient status, additional functional criteria and insurance authorization.  Follow Up Recommendations       Assistance Recommended at Discharge Set up Supervision/Assistance  Patient can return home with the following A little help with walking and/or transfers;A little help with bathing/dressing/bathroom;Help with stairs or ramp for entrance;Assistance with cooking/housework   Equipment Recommendations  Rolling walker (2 wheels)    Recommendations for Other  Services       Precautions / Restrictions Precautions Precautions: Fall Restrictions Weight Bearing Restrictions: Yes RLE Weight Bearing: Weight bearing as tolerated     Mobility  Bed Mobility Overal bed mobility: Needs Assistance Bed Mobility: Supine to Sit     Supine to sit: Supervision     General bed mobility comments: incrreased time with labored movement requiring use of bed rail with HOB flat    Transfers Overall transfer level: Needs assistance Equipment used: Rolling walker (2 wheels) Transfers: Sit to/from Stand, Bed to chair/wheelchair/BSC Sit to Stand: Supervision   Step pivot transfers: Supervision       General transfer comment: slightly labored movement, increased time    Ambulation/Gait Ambulation/Gait assistance: Supervision, Modified independent (Device/Increase time) Gait Distance (Feet): 100 Feet Assistive device: Rolling walker (2 wheels) Gait Pattern/deviations: Decreased step length - right, Decreased step length - left, Decreased stance time - right, Antalgic Gait velocity: decreased     General Gait Details: increased endurance distance for gait trainig with fair/good return for right heel to toe stepping without loss of balance   Stairs Stairs: Yes Stairs assistance: Min guard, Min assist Stair Management: Two rails, Step to pattern Number of Stairs: 4 General stair comments: demonstrates fair return for going up/down 4 steps using bilateral side rails, had 1 miss step when taking last step going down at bottom of stairwell with loss of balance having to sit on step without injury or increased knee pain, had patient repeat and able to go up/down steps safely.   Wheelchair Mobility    Modified Rankin (Stroke Patients Only)       Balance Overall balance assessment: Needs assistance Sitting-balance support: Feet supported, No upper  extremity supported Sitting balance-Leahy Scale: Good Sitting balance - Comments: seated at EOB    Standing balance support: During functional activity, Bilateral upper extremity supported Standing balance-Leahy Scale: Fair Standing balance comment: fair/good using RW                            Cognition Arousal/Alertness: Awake/alert Behavior During Therapy: WFL for tasks assessed/performed Overall Cognitive Status: Within Functional Limits for tasks assessed                                          Exercises Total Joint Exercises Ankle Circles/Pumps: 10 reps, Right, AROM, Supine, Strengthening Quad Sets: AROM, Right, 10 reps, Supine, Strengthening Short Arc Quad: AROM, Strengthening, Right, 10 reps, Supine Heel Slides: AROM, Strengthening, Right, 10 reps, Supine Goniometric ROM: Right knee: 0 - 105 degrees    General Comments        Pertinent Vitals/Pain Pain Assessment Pain Assessment: 0-10 Pain Score: 4  Pain Location: right knee Pain Descriptors / Indicators: Sore Pain Intervention(s): Limited activity within patient's tolerance, Monitored during session, Repositioned    Home Living                          Prior Function            PT Goals (current goals can now be found in the care plan section) Acute Rehab PT Goals Patient Stated Goal: return home with family to assist PT Goal Formulation: With patient Time For Goal Achievement: 08/03/22 Potential to Achieve Goals: Good Progress towards PT goals: Progressing toward goals    Frequency    BID      PT Plan Current plan remains appropriate    Co-evaluation              AM-PAC PT "6 Clicks" Mobility   Outcome Measure  Help needed turning from your back to your side while in a flat bed without using bedrails?: None Help needed moving from lying on your back to sitting on the side of a flat bed without using bedrails?: A Little Help needed moving to and from a bed to a chair (including a wheelchair)?: A Little Help needed standing up from a chair using  your arms (e.g., wheelchair or bedside chair)?: None Help needed to walk in hospital room?: A Little Help needed climbing 3-5 steps with a railing? : A Little 6 Click Score: 20    End of Session Equipment Utilized During Treatment: Gait belt Activity Tolerance: Patient tolerated treatment well;Patient limited by fatigue Patient left: in chair;with call bell/phone within reach Nurse Communication: Mobility status PT Visit Diagnosis: Unsteadiness on feet (R26.81);Other abnormalities of gait and mobility (R26.89);Muscle weakness (generalized) (M62.81)     Time: 1610-9604 PT Time Calculation (min) (ACUTE ONLY): 32 min  Charges:  $Gait Training: 8-22 mins $Therapeutic Exercise: 8-22 mins                     10:23 AM, 08/02/22 Ocie Bob, MPT Physical Therapist with [facility] [phone] office [phone] mobile phone

## 2022-08-02 NOTE — Progress Notes (Signed)
Ng Discharge Note  Admit Date:  08/01/2022 Discharge date: 08/02/2022   Maria Morrison to be D/C'd Home per MD order.  AVS completed. Patient/caregiver able to verbalize understanding.  Discharge Medication: Allergies as of 08/02/2022       Reactions   Codeine Nausea Only        Medication List     STOP taking these medications    acetaminophen 650 MG CR tablet Commonly known as: TYLENOL Replaced by: acetaminophen 500 MG tablet       TAKE these medications    acetaminophen 500 MG tablet Commonly known as: TYLENOL Take 1 tablet (500 mg total) by mouth every 6 (six) hours as needed. Replaces: acetaminophen 650 MG CR tablet   ASPERCREME MAX ROLL-ON EX Apply 1 Application topically as needed (pain.).   aspirin EC 325 MG tablet Take 1 tablet (325 mg total) by mouth daily with breakfast.   atorvastatin 10 MG tablet Commonly known as: LIPITOR Take 10 mg by mouth at bedtime.   bisacodyl 5 MG EC tablet Commonly known as: DULCOLAX Take 1 tablet (5 mg total) by mouth daily as needed for moderate constipation.   celecoxib 200 MG capsule Commonly known as: CeleBREX Take 1 capsule (200 mg total) by mouth 2 (two) times daily.   lisinopril 10 MG tablet Commonly known as: ZESTRIL Take 10 mg by mouth at bedtime.   Melatonin 3 MG Caps Take 3 mg by mouth at bedtime.   metFORMIN 500 MG tablet Commonly known as: GLUCOPHAGE Take 500 mg by mouth at bedtime.   methocarbamol 500 MG tablet Commonly known as: ROBAXIN Take 1 tablet (500 mg total) by mouth every 6 (six) hours as needed for muscle spasms.   omeprazole 20 MG tablet Commonly known as: PRILOSEC OTC Take 20 mg by mouth in the morning.   oxyCODONE 5 MG immediate release tablet Commonly known as: Oxy IR/ROXICODONE Take 1 tablet (5 mg total) by mouth every 4 (four) hours as needed for up to 7 days for severe pain. Post op pain 7 days   polyethylene glycol 17 g packet Commonly known as: MIRALAX / GLYCOLAX Take  17 g by mouth daily as needed for mild constipation.               Durable Medical Equipment  (From admission, onward)           Start     Ordered   08/01/22 1521  For home use only DME Walker rolling  Once       Comments: Patient s/p Right TKA and will benefit from use of RW for safety and pain control.  Question Answer Comment  Walker: With 5 Inch Wheels   Patient needs a walker to treat with the following condition Gait difficulty      08/01/22 1521            Discharge Assessment: Vitals:   08/01/22 2113 08/02/22 0414  BP: (!) 140/49 (!) 151/76  Pulse: 64 67  Resp: 20 18  Temp: 97.9 F (36.6 C) 97.6 F (36.4 C)  SpO2: 98% 98%   Skin clean, dry and intact without evidence of skin break down, no evidence of skin tears noted. IV catheter discontinued intact. Site without signs and symptoms of complications - no redness or edema noted at insertion site, patient denies c/o pain - only slight tenderness at site.  Dressing with slight pressure applied.  D/c Instructions-Education: Discharge instructions given to patient/family with verbalized understanding. D/c education completed  with patient/family including follow up instructions, medication list, d/c activities limitations if indicated, with other d/c instructions as indicated by MD - patient able to verbalize understanding, all questions fully answered. Patient instructed to return to ED, call 911, or call MD for any changes in condition.  Patient escorted via WC, and D/C home via private auto.  Cristal Ford, LPN 03/08/7251 66:44 AM

## 2022-08-02 NOTE — Progress Notes (Signed)
Foley cath removed per order, patient tolerated well. Instructed patient to notify staff when she needed to go to bathroom.

## 2022-08-02 NOTE — Care Management Obs Status (Signed)
MEDICARE OBSERVATION STATUS NOTIFICATION   Patient Details  Name: Maria Morrison MRN: [id number] Date of Birth: June 14, 1945   Medicare Observation Status Notification Given:  Yes    Corey Harold 08/02/2022, 11:16 AM

## 2022-08-02 NOTE — Progress Notes (Signed)
Foley removed by previous nurse, Emmaline Life, LPN at 1610, but was not charted.

## 2022-08-02 NOTE — Discharge Summary (Signed)
Physician Discharge Summary  Patient ID: Maria Morrison MRN: [id number] DOB/AGE: [birth year] 79 y.o.  Admit date: 08/01/2022 Discharge date: 08/02/2022  Admission Diagnoses: Osteoarthritis right knee  Discharge Diagnoses:  Principal Problem:   Osteoarthritis of right knee Active Problems:   Primary osteoarthritis of right knee   Discharged Condition: good  Hospital Course: Admit date Aug 01, 2022: Patient underwent right total knee arthroplasty under spinal anesthetic with a postop saphenous nerve block and zinrelef injection IntraOp.  She was able to tolerate physical therapy walking 45 feet with 0 to 100 degree range of motion.  Pain was well-controlled with oxycodone.  Postop day 1 Aug 02, 2022 the patient was in stable condition. She completed the stair training as was cleared by Therapy     Significant Diagnostic Studies: labs:     Latest Ref Rng & Units 08/02/2022    4:01 AM 07/26/2022   11:35 AM  CBC  WBC 4.0 - 10.5 K/uL 9.5  8.0   Hemoglobin 12.0 - 15.0 g/dL 9.9  82.9   Hematocrit 36.0 - 46.0 % 31.7  39.9   Platelets 150 - 400 K/uL 178  268       Latest Ref Rng & Units 08/02/2022    4:01 AM 07/26/2022   11:35 AM  BMP  Glucose 70 - 99 mg/dL 562  130   BUN 8 - 23 mg/dL 19  19   Creatinine 8.65 - 1.00 mg/dL 7.84  6.96   Sodium 295 - 145 mmol/L 135  136   Potassium 3.5 - 5.1 mmol/L 4.1  3.9   Chloride 98 - 111 mmol/L 105  103   CO2 22 - 32 mmol/L 19  24   Calcium 8.9 - 10.3 mg/dL 9.3  9.9       and radiology: X-Ray: Postop films revealed stable implant in good position  Treatments: surgery: Right total knee arthroplasty attune fixed-bearing posterior stabilized size 5 femur narrow size 4 tibia with 8 polyethylene insert.  Discharge Exam: Blood pressure (!) 151/76, pulse 67, temperature 97.6 F (36.4 C), temperature source Oral, resp. rate 18, height 5\' 3"  (1.6 m), weight 89.4 kg, SpO2 98 %. General appearance: alert, cooperative, and no distress Head:  Normocephalic, without obvious abnormality, atraumatic Neck: supple, symmetrical, trachea midline Resp: Breathing normally Cardio: regular rate and rhythm Extremities: Homans sign is negative, no sign of DVT Pulses: 2+ and symmetric Skin: normal  Disposition: Discharge disposition: 01-Home or Self Care       Discharge Instructions     Call MD / Call 911   Complete by: As directed    If you experience chest pain or shortness of breath, CALL 911 and be transported to the hospital emergency room.  If you develope a fever above 101 F, pus (white drainage) or increased drainage or redness at the wound, or calf pain, call your surgeon's office.   Constipation Prevention   Complete by: As directed    Drink plenty of fluids.  Prune juice may be helpful.  You may use a stool softener, such as Colace (over the counter) 100 mg twice a day.  Use MiraLax (over the counter) for constipation as needed.   Diet - low sodium heart healthy   Complete by: As directed    Discharge instructions   Complete by: As directed    CPM if your insurance company approved it,  the CPM on will be brought to your home.  Start it at 0 to 75 degrees and increase the  range of motion 10 degrees/day until you reach 120 degrees.  Use the CPM 6 hours/day.  You can divide the time up anyway you wish.  Blue foam pad.  This pad is to make sure your knee is fully straight.  Use this pad 30 minutes 3 times a day.  Home exercises perform as instructed by physical therapy.  Dressing: The dressing is made to last for 2 weeks unless it becomes soaked through.  You are able to take a shower if you have assistance and a shower chair.  Blood clot prevention: You are on aspirin for 6 weeks and you  will wear the stockings.  Pain control: Take Tylenol every 6 hours and then take your pain medication oxycodone every 4 hours as needed.  Make sure you are icing the knee 6 times a day for 30 minutes.  You also have Celebrex to help with  pain take that twice a day.   Increase activity slowly as tolerated   Complete by: As directed    Post-operative opioid taper instructions:   Complete by: As directed    POST-OPERATIVE OPIOID TAPER INSTRUCTIONS: It is important to wean off of your opioid medication as soon as possible. If you do not need pain medication after your surgery it is ok to stop day one. Opioids include: Codeine, Hydrocodone(Norco, Vicodin), Oxycodone(Percocet, oxycontin) and hydromorphone amongst others.  Long term and even short term use of opiods can cause: Increased pain response Dependence Constipation Depression Respiratory depression And more.  Withdrawal symptoms can include Flu like symptoms Nausea, vomiting And more Techniques to manage these symptoms Hydrate well Eat regular healthy meals Stay active Use relaxation techniques(deep breathing, meditating, yoga) Do Not substitute Alcohol to help with tapering If you have been on opioids for less than two weeks and do not have pain than it is ok to stop all together.  Plan to wean off of opioids This plan should start within one week post op of your joint replacement. Maintain the same interval or time between taking each dose and first decrease the dose.  Cut the total daily intake of opioids by one tablet each day Next start to increase the time between doses. The last dose that should be eliminated is the evening dose.         Allergies as of 08/02/2022       Reactions   Codeine Nausea Only        Medication List     STOP taking these medications    acetaminophen 650 MG CR tablet Commonly known as: TYLENOL Replaced by: acetaminophen 500 MG tablet       TAKE these medications    acetaminophen 500 MG tablet Commonly known as: TYLENOL Take 1 tablet (500 mg total) by mouth every 6 (six) hours as needed. Replaces: acetaminophen 650 MG CR tablet   ASPERCREME MAX ROLL-ON EX Apply 1 Application topically as needed  (pain.).   aspirin EC 325 MG tablet Take 1 tablet (325 mg total) by mouth daily with breakfast.   atorvastatin 10 MG tablet Commonly known as: LIPITOR Take 10 mg by mouth at bedtime.   bisacodyl 5 MG EC tablet Commonly known as: DULCOLAX Take 1 tablet (5 mg total) by mouth daily as needed for moderate constipation.   celecoxib 200 MG capsule Commonly known as: CeleBREX Take 1 capsule (200 mg total) by mouth 2 (two) times daily.   lisinopril 10 MG tablet Commonly known as: ZESTRIL Take 10 mg by mouth at bedtime.  Melatonin 3 MG Caps Take 3 mg by mouth at bedtime.   metFORMIN 500 MG tablet Commonly known as: GLUCOPHAGE Take 500 mg by mouth at bedtime.   methocarbamol 500 MG tablet Commonly known as: ROBAXIN Take 1 tablet (500 mg total) by mouth every 6 (six) hours as needed for muscle spasms.   omeprazole 20 MG tablet Commonly known as: PRILOSEC OTC Take 20 mg by mouth in the morning.   oxyCODONE 5 MG immediate release tablet Commonly known as: Oxy IR/ROXICODONE Take 1 tablet (5 mg total) by mouth every 4 (four) hours as needed for up to 7 days for severe pain. Post op pain 7 days   polyethylene glycol 17 g packet Commonly known as: MIRALAX / GLYCOLAX Take 17 g by mouth daily as needed for mild constipation.               Durable Medical Equipment  (From admission, onward)           Start     Ordered   08/01/22 1521  For home use only DME Walker rolling  Once       Comments: Patient s/p Right TKA and will benefit from use of RW for safety and pain control.  Question Answer Comment  Walker: With 5 Inch Wheels   Patient needs a walker to treat with the following condition Gait difficulty      08/01/22 1521            Follow-up Information     [facility].   Specialty: Home Health Services Why: Will contact you to schedule home health visits. Contact information: [address] [place]  [address] [phone]                 Signed: Fuller Canada 08/02/2022, 10:41 AM

## 2022-08-03 ENCOUNTER — Other Ambulatory Visit: Payer: Self-pay

## 2022-08-03 NOTE — Telephone Encounter (Signed)
Dr. Romeo Apple  Patient called and states she is to have a machine delivered to her to help with her knee, it is called  CPM .  She is wanting to know if insurance did not approve of it or it hasn't been delivered yet.    Please call her back at [phone]

## 2022-08-04 DIAGNOSIS — K219 Gastro-esophageal reflux disease without esophagitis: Secondary | ICD-10-CM | POA: Diagnosis not present

## 2022-08-04 DIAGNOSIS — K5900 Constipation, unspecified: Secondary | ICD-10-CM | POA: Diagnosis not present

## 2022-08-04 DIAGNOSIS — Z7982 Long term (current) use of aspirin: Secondary | ICD-10-CM | POA: Diagnosis not present

## 2022-08-04 DIAGNOSIS — E785 Hyperlipidemia, unspecified: Secondary | ICD-10-CM | POA: Diagnosis not present

## 2022-08-04 DIAGNOSIS — E119 Type 2 diabetes mellitus without complications: Secondary | ICD-10-CM | POA: Diagnosis not present

## 2022-08-04 DIAGNOSIS — Z471 Aftercare following joint replacement surgery: Secondary | ICD-10-CM | POA: Diagnosis not present

## 2022-08-04 DIAGNOSIS — Z86010 Personal history of colonic polyps: Secondary | ICD-10-CM | POA: Diagnosis not present

## 2022-08-04 DIAGNOSIS — I10 Essential (primary) hypertension: Secondary | ICD-10-CM | POA: Diagnosis not present

## 2022-08-04 DIAGNOSIS — Z96651 Presence of right artificial knee joint: Secondary | ICD-10-CM | POA: Diagnosis not present

## 2022-08-06 DIAGNOSIS — K219 Gastro-esophageal reflux disease without esophagitis: Secondary | ICD-10-CM | POA: Diagnosis not present

## 2022-08-06 DIAGNOSIS — K5900 Constipation, unspecified: Secondary | ICD-10-CM | POA: Diagnosis not present

## 2022-08-06 DIAGNOSIS — I10 Essential (primary) hypertension: Secondary | ICD-10-CM | POA: Diagnosis not present

## 2022-08-06 DIAGNOSIS — Z7982 Long term (current) use of aspirin: Secondary | ICD-10-CM | POA: Diagnosis not present

## 2022-08-06 DIAGNOSIS — E119 Type 2 diabetes mellitus without complications: Secondary | ICD-10-CM | POA: Diagnosis not present

## 2022-08-06 DIAGNOSIS — Z471 Aftercare following joint replacement surgery: Secondary | ICD-10-CM | POA: Diagnosis not present

## 2022-08-06 DIAGNOSIS — E785 Hyperlipidemia, unspecified: Secondary | ICD-10-CM | POA: Diagnosis not present

## 2022-08-06 DIAGNOSIS — Z96651 Presence of right artificial knee joint: Secondary | ICD-10-CM | POA: Diagnosis not present

## 2022-08-06 DIAGNOSIS — Z86010 Personal history of colonic polyps: Secondary | ICD-10-CM | POA: Diagnosis not present

## 2022-08-07 ENCOUNTER — Encounter (HOSPITAL_COMMUNITY): Payer: Self-pay | Admitting: Orthopedic Surgery

## 2022-08-07 DIAGNOSIS — I10 Essential (primary) hypertension: Secondary | ICD-10-CM | POA: Diagnosis not present

## 2022-08-07 DIAGNOSIS — E785 Hyperlipidemia, unspecified: Secondary | ICD-10-CM | POA: Diagnosis not present

## 2022-08-07 DIAGNOSIS — Z7982 Long term (current) use of aspirin: Secondary | ICD-10-CM | POA: Diagnosis not present

## 2022-08-07 DIAGNOSIS — Z86010 Personal history of colonic polyps: Secondary | ICD-10-CM | POA: Diagnosis not present

## 2022-08-07 DIAGNOSIS — K5900 Constipation, unspecified: Secondary | ICD-10-CM | POA: Diagnosis not present

## 2022-08-07 DIAGNOSIS — Z96651 Presence of right artificial knee joint: Secondary | ICD-10-CM | POA: Diagnosis not present

## 2022-08-07 DIAGNOSIS — E119 Type 2 diabetes mellitus without complications: Secondary | ICD-10-CM | POA: Diagnosis not present

## 2022-08-07 DIAGNOSIS — K219 Gastro-esophageal reflux disease without esophagitis: Secondary | ICD-10-CM | POA: Diagnosis not present

## 2022-08-07 DIAGNOSIS — Z471 Aftercare following joint replacement surgery: Secondary | ICD-10-CM | POA: Diagnosis not present

## 2022-08-08 NOTE — Telephone Encounter (Signed)
Patients calls are blocked by smart call blocker I called and she did not answer. Her  AVS is at front desk if she has questions I highlighted it.

## 2022-08-09 DIAGNOSIS — Z86010 Personal history of colonic polyps: Secondary | ICD-10-CM | POA: Diagnosis not present

## 2022-08-09 DIAGNOSIS — Z471 Aftercare following joint replacement surgery: Secondary | ICD-10-CM | POA: Diagnosis not present

## 2022-08-09 DIAGNOSIS — E119 Type 2 diabetes mellitus without complications: Secondary | ICD-10-CM | POA: Diagnosis not present

## 2022-08-09 DIAGNOSIS — K219 Gastro-esophageal reflux disease without esophagitis: Secondary | ICD-10-CM | POA: Diagnosis not present

## 2022-08-09 DIAGNOSIS — Z7982 Long term (current) use of aspirin: Secondary | ICD-10-CM | POA: Diagnosis not present

## 2022-08-09 DIAGNOSIS — E785 Hyperlipidemia, unspecified: Secondary | ICD-10-CM | POA: Diagnosis not present

## 2022-08-09 DIAGNOSIS — Z96651 Presence of right artificial knee joint: Secondary | ICD-10-CM | POA: Diagnosis not present

## 2022-08-09 DIAGNOSIS — I10 Essential (primary) hypertension: Secondary | ICD-10-CM | POA: Diagnosis not present

## 2022-08-09 DIAGNOSIS — K5900 Constipation, unspecified: Secondary | ICD-10-CM | POA: Diagnosis not present

## 2022-08-10 LAB — BPAM RBC
Blood Product Expiration Date: 202406262359
Blood Product Expiration Date: 202406262359
Unit Type and Rh: 6200

## 2022-08-10 LAB — TYPE AND SCREEN
ABO/RH(D): A POS
Antibody Screen: NEGATIVE
Unit division: 0
Unit division: 0

## 2022-08-11 DIAGNOSIS — E785 Hyperlipidemia, unspecified: Secondary | ICD-10-CM | POA: Diagnosis not present

## 2022-08-11 DIAGNOSIS — I10 Essential (primary) hypertension: Secondary | ICD-10-CM | POA: Diagnosis not present

## 2022-08-11 DIAGNOSIS — E119 Type 2 diabetes mellitus without complications: Secondary | ICD-10-CM | POA: Diagnosis not present

## 2022-08-11 DIAGNOSIS — Z86010 Personal history of colonic polyps: Secondary | ICD-10-CM | POA: Diagnosis not present

## 2022-08-11 DIAGNOSIS — K219 Gastro-esophageal reflux disease without esophagitis: Secondary | ICD-10-CM | POA: Diagnosis not present

## 2022-08-11 DIAGNOSIS — Z96651 Presence of right artificial knee joint: Secondary | ICD-10-CM | POA: Diagnosis not present

## 2022-08-11 DIAGNOSIS — Z7982 Long term (current) use of aspirin: Secondary | ICD-10-CM | POA: Diagnosis not present

## 2022-08-11 DIAGNOSIS — Z471 Aftercare following joint replacement surgery: Secondary | ICD-10-CM | POA: Diagnosis not present

## 2022-08-11 DIAGNOSIS — K5900 Constipation, unspecified: Secondary | ICD-10-CM | POA: Diagnosis not present

## 2022-08-14 ENCOUNTER — Ambulatory Visit (INDEPENDENT_AMBULATORY_CARE_PROVIDER_SITE_OTHER): Payer: Medicare Other | Admitting: Orthopedic Surgery

## 2022-08-14 ENCOUNTER — Encounter: Payer: Self-pay | Admitting: Orthopedic Surgery

## 2022-08-14 VITALS — BP 194/82 | HR 84 | Ht 63.0 in | Wt 201.0 lb

## 2022-08-14 DIAGNOSIS — E785 Hyperlipidemia, unspecified: Secondary | ICD-10-CM | POA: Diagnosis not present

## 2022-08-14 DIAGNOSIS — K219 Gastro-esophageal reflux disease without esophagitis: Secondary | ICD-10-CM | POA: Diagnosis not present

## 2022-08-14 DIAGNOSIS — Z471 Aftercare following joint replacement surgery: Secondary | ICD-10-CM | POA: Diagnosis not present

## 2022-08-14 DIAGNOSIS — Z7982 Long term (current) use of aspirin: Secondary | ICD-10-CM | POA: Diagnosis not present

## 2022-08-14 DIAGNOSIS — Z96651 Presence of right artificial knee joint: Secondary | ICD-10-CM | POA: Diagnosis not present

## 2022-08-14 DIAGNOSIS — E119 Type 2 diabetes mellitus without complications: Secondary | ICD-10-CM | POA: Diagnosis not present

## 2022-08-14 DIAGNOSIS — I10 Essential (primary) hypertension: Secondary | ICD-10-CM | POA: Diagnosis not present

## 2022-08-14 DIAGNOSIS — Z86010 Personal history of colonic polyps: Secondary | ICD-10-CM | POA: Diagnosis not present

## 2022-08-14 DIAGNOSIS — K5900 Constipation, unspecified: Secondary | ICD-10-CM | POA: Diagnosis not present

## 2022-08-14 MED ORDER — OXYCODONE HCL 5 MG PO TABS
5.0000 mg | ORAL_TABLET | Freq: Three times a day (TID) | ORAL | 0 refills | Status: AC | PRN
Start: 2022-08-14 — End: 2022-08-19

## 2022-08-14 NOTE — Progress Notes (Signed)
Chief Complaint  Patient presents with   Routine Post Op    Post op follow up 08/01/22 right total knee replacement     First postop visit  status post right total knee with attune fixed-bearing posterior stabilized implant Size 5 femur size 4 tibia with 8 insert size 35 patella  Relevant medications: Aspirin daily with breakfast twice a day Celebrex 200 mg twice a day  methocarbamol 500 mg every 6 as needed for muscle spasms  oxycodone immediate every 4 as needed for pain  [place] is here daily to get her staples out a look at the wound wound looks fine but unguinal wait till Thursday to take the staples out.  The TED hose are not working well for her because of her leg morphology so we remove those she will continue with aspirin  Her range of motion is now 0-95 which is good at 2 weeks  I will see her Thursday to get her staples out Refill of medication  Meds ordered this encounter  Medications   oxyCODONE (OXY IR/ROXICODONE) 5 MG immediate release tablet    Sig: Take 1 tablet (5 mg total) by mouth every 8 (eight) hours as needed for up to 5 days for severe pain.    Dispense:  15 tablet    Refill:  0

## 2022-08-14 NOTE — Addendum Note (Signed)
Addended byCaffie Damme on: 08/14/2022 11:00 AM   Modules accepted: Orders

## 2022-08-16 DIAGNOSIS — K219 Gastro-esophageal reflux disease without esophagitis: Secondary | ICD-10-CM | POA: Diagnosis not present

## 2022-08-16 DIAGNOSIS — Z471 Aftercare following joint replacement surgery: Secondary | ICD-10-CM | POA: Diagnosis not present

## 2022-08-16 DIAGNOSIS — Z86010 Personal history of colonic polyps: Secondary | ICD-10-CM | POA: Diagnosis not present

## 2022-08-16 DIAGNOSIS — E119 Type 2 diabetes mellitus without complications: Secondary | ICD-10-CM | POA: Diagnosis not present

## 2022-08-16 DIAGNOSIS — K5900 Constipation, unspecified: Secondary | ICD-10-CM | POA: Diagnosis not present

## 2022-08-16 DIAGNOSIS — Z96651 Presence of right artificial knee joint: Secondary | ICD-10-CM | POA: Diagnosis not present

## 2022-08-16 DIAGNOSIS — Z7982 Long term (current) use of aspirin: Secondary | ICD-10-CM | POA: Diagnosis not present

## 2022-08-16 DIAGNOSIS — E785 Hyperlipidemia, unspecified: Secondary | ICD-10-CM | POA: Diagnosis not present

## 2022-08-16 DIAGNOSIS — I10 Essential (primary) hypertension: Secondary | ICD-10-CM | POA: Diagnosis not present

## 2022-08-17 ENCOUNTER — Ambulatory Visit (INDEPENDENT_AMBULATORY_CARE_PROVIDER_SITE_OTHER): Payer: Medicare Other | Admitting: Orthopedic Surgery

## 2022-08-17 ENCOUNTER — Encounter: Payer: Self-pay | Admitting: Orthopedic Surgery

## 2022-08-17 DIAGNOSIS — E785 Hyperlipidemia, unspecified: Secondary | ICD-10-CM | POA: Diagnosis not present

## 2022-08-17 DIAGNOSIS — K219 Gastro-esophageal reflux disease without esophagitis: Secondary | ICD-10-CM | POA: Diagnosis not present

## 2022-08-17 DIAGNOSIS — K5900 Constipation, unspecified: Secondary | ICD-10-CM | POA: Diagnosis not present

## 2022-08-17 DIAGNOSIS — Z471 Aftercare following joint replacement surgery: Secondary | ICD-10-CM | POA: Diagnosis not present

## 2022-08-17 DIAGNOSIS — E119 Type 2 diabetes mellitus without complications: Secondary | ICD-10-CM | POA: Diagnosis not present

## 2022-08-17 DIAGNOSIS — Z86010 Personal history of colonic polyps: Secondary | ICD-10-CM | POA: Diagnosis not present

## 2022-08-17 DIAGNOSIS — Z7982 Long term (current) use of aspirin: Secondary | ICD-10-CM | POA: Diagnosis not present

## 2022-08-17 DIAGNOSIS — I10 Essential (primary) hypertension: Secondary | ICD-10-CM | POA: Diagnosis not present

## 2022-08-17 DIAGNOSIS — Z96651 Presence of right artificial knee joint: Secondary | ICD-10-CM | POA: Diagnosis not present

## 2022-08-17 NOTE — Patient Instructions (Signed)
Ok to shower today take off bandage, up to you if you want to continue bandage or stop Go for therapy expect improvement over the next few weeks  Continue trying to increase your activity   Use soap and water on it only over the incision for the next week then you can add in an unscented lotion

## 2022-08-17 NOTE — Progress Notes (Signed)
Chief Complaint  Patient presents with   Post-op Follow-up    08/01/22 right total knee    Encounter Diagnosis  Name Primary?   S/P total knee replacement, right 08/01/22 Yes   Return today for suture removal.  All staples were removed.  The wound looks clean.  The patient will return in 3 weeks and continue physical therapy and we will continue opioid taper.

## 2022-08-21 ENCOUNTER — Telehealth: Payer: Self-pay

## 2022-08-21 DIAGNOSIS — Z7982 Long term (current) use of aspirin: Secondary | ICD-10-CM | POA: Diagnosis not present

## 2022-08-21 DIAGNOSIS — I10 Essential (primary) hypertension: Secondary | ICD-10-CM | POA: Diagnosis not present

## 2022-08-21 DIAGNOSIS — K219 Gastro-esophageal reflux disease without esophagitis: Secondary | ICD-10-CM | POA: Diagnosis not present

## 2022-08-21 DIAGNOSIS — Z471 Aftercare following joint replacement surgery: Secondary | ICD-10-CM | POA: Diagnosis not present

## 2022-08-21 DIAGNOSIS — Z86010 Personal history of colonic polyps: Secondary | ICD-10-CM | POA: Diagnosis not present

## 2022-08-21 DIAGNOSIS — E119 Type 2 diabetes mellitus without complications: Secondary | ICD-10-CM | POA: Diagnosis not present

## 2022-08-21 DIAGNOSIS — Z96651 Presence of right artificial knee joint: Secondary | ICD-10-CM | POA: Diagnosis not present

## 2022-08-21 DIAGNOSIS — K5900 Constipation, unspecified: Secondary | ICD-10-CM | POA: Diagnosis not present

## 2022-08-21 DIAGNOSIS — E785 Hyperlipidemia, unspecified: Secondary | ICD-10-CM | POA: Diagnosis not present

## 2022-08-21 NOTE — Telephone Encounter (Signed)
Patient is here in the lobby and has a staple left in her right knee. She wants to have it removed please.

## 2022-08-24 DIAGNOSIS — E785 Hyperlipidemia, unspecified: Secondary | ICD-10-CM | POA: Diagnosis not present

## 2022-08-24 DIAGNOSIS — K5900 Constipation, unspecified: Secondary | ICD-10-CM | POA: Diagnosis not present

## 2022-08-24 DIAGNOSIS — Z96651 Presence of right artificial knee joint: Secondary | ICD-10-CM | POA: Diagnosis not present

## 2022-08-24 DIAGNOSIS — K219 Gastro-esophageal reflux disease without esophagitis: Secondary | ICD-10-CM | POA: Diagnosis not present

## 2022-08-24 DIAGNOSIS — I10 Essential (primary) hypertension: Secondary | ICD-10-CM | POA: Diagnosis not present

## 2022-08-24 DIAGNOSIS — Z471 Aftercare following joint replacement surgery: Secondary | ICD-10-CM | POA: Diagnosis not present

## 2022-08-24 DIAGNOSIS — E119 Type 2 diabetes mellitus without complications: Secondary | ICD-10-CM | POA: Diagnosis not present

## 2022-08-24 DIAGNOSIS — Z86010 Personal history of colonic polyps: Secondary | ICD-10-CM | POA: Diagnosis not present

## 2022-08-24 DIAGNOSIS — Z7982 Long term (current) use of aspirin: Secondary | ICD-10-CM | POA: Diagnosis not present

## 2022-08-25 DIAGNOSIS — M25661 Stiffness of right knee, not elsewhere classified: Secondary | ICD-10-CM | POA: Diagnosis not present

## 2022-08-25 DIAGNOSIS — M25561 Pain in right knee: Secondary | ICD-10-CM | POA: Diagnosis not present

## 2022-08-28 DIAGNOSIS — M25661 Stiffness of right knee, not elsewhere classified: Secondary | ICD-10-CM | POA: Diagnosis not present

## 2022-08-28 DIAGNOSIS — M25561 Pain in right knee: Secondary | ICD-10-CM | POA: Diagnosis not present

## 2022-08-29 DIAGNOSIS — E119 Type 2 diabetes mellitus without complications: Secondary | ICD-10-CM | POA: Diagnosis not present

## 2022-08-29 DIAGNOSIS — H353131 Nonexudative age-related macular degeneration, bilateral, early dry stage: Secondary | ICD-10-CM | POA: Diagnosis not present

## 2022-08-29 DIAGNOSIS — H43813 Vitreous degeneration, bilateral: Secondary | ICD-10-CM | POA: Diagnosis not present

## 2022-08-29 DIAGNOSIS — Z961 Presence of intraocular lens: Secondary | ICD-10-CM | POA: Diagnosis not present

## 2022-08-30 DIAGNOSIS — M25661 Stiffness of right knee, not elsewhere classified: Secondary | ICD-10-CM | POA: Diagnosis not present

## 2022-08-30 DIAGNOSIS — M25561 Pain in right knee: Secondary | ICD-10-CM | POA: Diagnosis not present

## 2022-09-01 DIAGNOSIS — M25661 Stiffness of right knee, not elsewhere classified: Secondary | ICD-10-CM | POA: Diagnosis not present

## 2022-09-01 DIAGNOSIS — M25561 Pain in right knee: Secondary | ICD-10-CM | POA: Diagnosis not present

## 2022-09-04 DIAGNOSIS — M25661 Stiffness of right knee, not elsewhere classified: Secondary | ICD-10-CM | POA: Diagnosis not present

## 2022-09-04 DIAGNOSIS — M25561 Pain in right knee: Secondary | ICD-10-CM | POA: Diagnosis not present

## 2022-09-06 DIAGNOSIS — M25661 Stiffness of right knee, not elsewhere classified: Secondary | ICD-10-CM | POA: Diagnosis not present

## 2022-09-06 DIAGNOSIS — M25561 Pain in right knee: Secondary | ICD-10-CM | POA: Diagnosis not present

## 2022-09-08 ENCOUNTER — Encounter: Payer: Self-pay | Admitting: Orthopedic Surgery

## 2022-09-08 ENCOUNTER — Ambulatory Visit (INDEPENDENT_AMBULATORY_CARE_PROVIDER_SITE_OTHER): Payer: Medicare Other | Admitting: Orthopedic Surgery

## 2022-09-08 VITALS — Ht 63.0 in | Wt 201.0 lb

## 2022-09-08 DIAGNOSIS — Z96651 Presence of right artificial knee joint: Secondary | ICD-10-CM

## 2022-09-08 DIAGNOSIS — M25561 Pain in right knee: Secondary | ICD-10-CM | POA: Diagnosis not present

## 2022-09-08 DIAGNOSIS — M25661 Stiffness of right knee, not elsewhere classified: Secondary | ICD-10-CM | POA: Diagnosis not present

## 2022-09-08 NOTE — Progress Notes (Signed)
Chief Complaint  Patient presents with   Post-op Follow-up    Right knee replacement 08/01/22    Encounter Diagnosis  Name Primary?   S/P total knee replacement, right 08/01/22 Yes    38 days 5 weeks status post right total knee doing well 0-95 knee flexion good strength walking well okay to drive follow-up in 5 weeks

## 2022-09-12 DIAGNOSIS — M25661 Stiffness of right knee, not elsewhere classified: Secondary | ICD-10-CM | POA: Diagnosis not present

## 2022-09-12 DIAGNOSIS — M25561 Pain in right knee: Secondary | ICD-10-CM | POA: Diagnosis not present

## 2022-09-13 DIAGNOSIS — M25661 Stiffness of right knee, not elsewhere classified: Secondary | ICD-10-CM | POA: Diagnosis not present

## 2022-09-13 DIAGNOSIS — M25561 Pain in right knee: Secondary | ICD-10-CM | POA: Diagnosis not present

## 2022-09-15 DIAGNOSIS — M25661 Stiffness of right knee, not elsewhere classified: Secondary | ICD-10-CM | POA: Diagnosis not present

## 2022-09-15 DIAGNOSIS — M25561 Pain in right knee: Secondary | ICD-10-CM | POA: Diagnosis not present

## 2022-09-18 DIAGNOSIS — M25561 Pain in right knee: Secondary | ICD-10-CM | POA: Diagnosis not present

## 2022-09-18 DIAGNOSIS — M25661 Stiffness of right knee, not elsewhere classified: Secondary | ICD-10-CM | POA: Diagnosis not present

## 2022-09-20 DIAGNOSIS — M25561 Pain in right knee: Secondary | ICD-10-CM | POA: Diagnosis not present

## 2022-09-20 DIAGNOSIS — M25661 Stiffness of right knee, not elsewhere classified: Secondary | ICD-10-CM | POA: Diagnosis not present

## 2022-09-22 DIAGNOSIS — M25661 Stiffness of right knee, not elsewhere classified: Secondary | ICD-10-CM | POA: Diagnosis not present

## 2022-09-22 DIAGNOSIS — M25561 Pain in right knee: Secondary | ICD-10-CM | POA: Diagnosis not present

## 2022-09-26 DIAGNOSIS — M25561 Pain in right knee: Secondary | ICD-10-CM | POA: Diagnosis not present

## 2022-09-26 DIAGNOSIS — M25661 Stiffness of right knee, not elsewhere classified: Secondary | ICD-10-CM | POA: Diagnosis not present

## 2022-09-27 DIAGNOSIS — M25561 Pain in right knee: Secondary | ICD-10-CM | POA: Diagnosis not present

## 2022-09-27 DIAGNOSIS — M25661 Stiffness of right knee, not elsewhere classified: Secondary | ICD-10-CM | POA: Diagnosis not present

## 2022-09-29 DIAGNOSIS — M25661 Stiffness of right knee, not elsewhere classified: Secondary | ICD-10-CM | POA: Diagnosis not present

## 2022-09-29 DIAGNOSIS — M25561 Pain in right knee: Secondary | ICD-10-CM | POA: Diagnosis not present

## 2022-10-02 DIAGNOSIS — M25561 Pain in right knee: Secondary | ICD-10-CM | POA: Diagnosis not present

## 2022-10-02 DIAGNOSIS — M25661 Stiffness of right knee, not elsewhere classified: Secondary | ICD-10-CM | POA: Diagnosis not present

## 2022-10-16 ENCOUNTER — Encounter: Payer: Medicare Other | Admitting: Orthopedic Surgery

## 2022-10-18 DIAGNOSIS — E0590 Thyrotoxicosis, unspecified without thyrotoxic crisis or storm: Secondary | ICD-10-CM | POA: Diagnosis not present

## 2022-10-18 DIAGNOSIS — R7989 Other specified abnormal findings of blood chemistry: Secondary | ICD-10-CM | POA: Diagnosis not present

## 2022-10-19 LAB — T4, FREE: Free T4: 1.09 ng/dL (ref 0.82–1.77)

## 2022-10-25 ENCOUNTER — Encounter: Payer: Self-pay | Admitting: Nurse Practitioner

## 2022-10-25 ENCOUNTER — Ambulatory Visit: Payer: Medicare Other | Admitting: Nurse Practitioner

## 2022-10-25 VITALS — BP 133/78 | HR 65 | Ht 63.0 in | Wt 189.6 lb

## 2022-10-25 DIAGNOSIS — R7989 Other specified abnormal findings of blood chemistry: Secondary | ICD-10-CM | POA: Diagnosis not present

## 2022-10-25 DIAGNOSIS — E0590 Thyrotoxicosis, unspecified without thyrotoxic crisis or storm: Secondary | ICD-10-CM

## 2022-10-25 NOTE — Progress Notes (Signed)
10/25/2022     Endocrinology Follow Up Note    Subjective:    Patient ID: Maria Morrison, female    DOB: 1945-10-31, PCP Assunta Found, MD.   Past Medical History:  Diagnosis Date   Diabetes mellitus without complication (HCC)    GERD (gastroesophageal reflux disease)    HLD (hyperlipidemia)    HTN (hypertension)    Sleep apnea     Past Surgical History:  Procedure Laterality Date   APPENDECTOMY     BACK SURGERY     COLONOSCOPY N/A 02/25/2014   Procedure: COLONOSCOPY;  Surgeon: Corbin Ade, MD;  two tubular adenomas, anal papilla, internal hemorrhoids.  Recommended repeat colonoscopy in 5 years.   COLONOSCOPY N/A 06/16/2020   Surgeon: Corbin Ade, MD;  entirely normal exam.  No recommendations to repeat due to age.   INJECTION KNEE Right 04/2022   TOTAL KNEE ARTHROPLASTY Right 08/01/2022   Procedure: TOTAL KNEE ARTHROPLASTY;  Surgeon: Vickki Hearing, MD;  Location: [facility];  Service: Orthopedics;  Laterality: Right;   TUBAL LIGATION      Social History   Socioeconomic History   Marital status: Married    Spouse name: Jomarie Longs   Number of children: 0   Years of education: 13   Highest education level: Not on file  Occupational History    Comment: retired  Tobacco Use   Smoking status: Former   Smokeless tobacco: Never  Substance and Sexual Activity   Alcohol use: No    Alcohol/week: 0.0 standard drinks of alcohol   Drug use: No   Sexual activity: Not on file  Other Topics Concern   Not on file  Social History Narrative   Consumes 3-4 cans of soda daily   Social Determinants of Health   Financial Resource Strain: Not on file  Food Insecurity: No Food Insecurity (08/01/2022)   Hunger Vital Sign    Worried About Running Out of Food in the Last Year: Never true    Ran Out of Food in the Last Year: Never true  Transportation Needs: No Transportation Needs (08/01/2022)   PRAPARE - Administrator, Civil Service (Medical): No     Lack of Transportation (Non-Medical): No  Physical Activity: Not on file  Stress: Not on file  Social Connections: Not on file    Family History  Problem Relation Age of Onset   Hypertension Mother    Stroke Mother    Hypertension Father    Diabetes Father    Parkinson's disease Father    Stroke Maternal Grandfather    Stroke Paternal Grandmother    Stroke Paternal Grandfather    Colon cancer Neg Hx     Outpatient Encounter Medications as of 10/25/2022  Medication Sig   acetaminophen (TYLENOL) 500 MG tablet Take 1 tablet (500 mg total) by mouth every 6 (six) hours as needed.   aspirin EC 325 MG tablet Take 1 tablet (325 mg total) by mouth daily with breakfast.   atorvastatin (LIPITOR) 10 MG tablet Take 10 mg by mouth at bedtime.   bisacodyl (DULCOLAX) 5 MG EC tablet Take 1 tablet (5 mg total) by mouth daily as needed for moderate constipation.   celecoxib (CELEBREX) 200 MG capsule Take 1 capsule (200 mg total) by mouth 2 (two) times daily.   estradiol (VIVELLE-DOT) 0.0375 MG/24HR APPLY 1 PATCH TOPICALLY TWICE A WEEK   lisinopril (PRINIVIL,ZESTRIL) 10 MG tablet Take 10 mg by mouth at bedtime.   Melatonin 3  MG CAPS Take 3 mg by mouth at bedtime.   Menthol, Topical Analgesic, (ASPERCREME MAX ROLL-ON EX) Apply 1 Application topically as needed (pain.).   metFORMIN (GLUCOPHAGE) 500 MG tablet Take 500 mg by mouth at bedtime.   methocarbamol (ROBAXIN) 500 MG tablet Take 1 tablet (500 mg total) by mouth every 6 (six) hours as needed for muscle spasms.   omeprazole (PRILOSEC OTC) 20 MG tablet Take 20 mg by mouth in the morning.   oxyCODONE (OXY IR/ROXICODONE) 5 MG immediate release tablet TAKE 1 TABLET BY MOUTH EVERY 8 HOURS AS NEEDED FOR UP TO 5 DAYS FOR SEVERE PAIN   polyethylene glycol (MIRALAX / GLYCOLAX) 17 g packet Take 17 g by mouth daily as needed for mild constipation.   progesterone (PROMETRIUM) 100 MG capsule Take 1 capsule by mouth at bedtime.   Facility-Administered Encounter  Medications as of 10/25/2022  Medication   bupivacaine-meloxicam ER (ZYNRELEF) injection 400 mg    ALLERGIES: Allergies  Allergen Reactions   Codeine Nausea Only    VACCINATION STATUS: Immunization History  Administered Date(s) Administered   Influenza-Unspecified 12/05/2014, 12/04/2017     HPI  Maria Morrison is 77 y.o. female who presents today with a medical history as above. she is being seen in follow up after being seen in consultation for hyperthyroidism requested by Assunta Found, MD.  she has been dealing with symptoms of loose bowels, hot flashes, and tremors intermittently for 10-15 years. These symptoms are progressively worsening and troubling to her.  her most recent thyroid labs revealed marginally suppressed TSH of 0.359 on 10/13/21.  she denies dysphagia, choking, shortness of breath, no recent voice change.    she denies known family history of thyroid dysfunction and denies family hx of thyroid cancer. she denies personal history of goiter. she is not on any anti-thyroid medications nor on any thyroid hormone supplements. Denies use of Biotin containing supplements.  she is willing to proceed with appropriate work up and therapy for thyrotoxicosis.  She has never had any imaging of her thyroid in the past, nor been exposed to radiation.  She mentions her PCP thinks her symptoms and suppression of her TSH could be contributed by her Estradiol and Progesterone use (prescribed to control hot flashes).   Review of systems  Constitutional: + Minimally fluctuating body weight, current There is no height or weight on file to calculate BMI., no fatigue, + subjective hyperthermia-improved since stopping her hormones, no subjective hypothermia Eyes: no blurry vision, no xerophthalmia ENT: no sore throat, no nodules palpated in throat, no dysphagia/odynophagia, no hoarseness Cardiovascular: no chest pain, no shortness of breath, no palpitations, no leg swelling Respiratory:  no cough, no shortness of breath Gastrointestinal: no nausea/vomiting/diarrhea, chronic loose bowels Musculoskeletal: no muscle/joint aches Skin: no rashes, no hyperemia Neurological: + intermittent tremors-stable, no numbness, no tingling, no dizziness Psychiatric: no depression, no anxiety   Objective:    There were no vitals taken for this visit.  Wt Readings from Last 3 Encounters:  09/08/22 201 lb (91.2 kg)  08/14/22 201 lb (91.2 kg)  08/01/22 197 lb (89.4 kg)     BP Readings from Last 3 Encounters:  08/14/22 (!) 194/82  08/02/22 (!) 151/76  07/26/22 (!) 141/68    Physical Exam- Limited  Constitutional:  There is no height or weight on file to calculate BMI. , not in acute distress, normal state of mind Eyes:  EOMI, no exophthalmos Musculoskeletal: no gross deformities, strength intact in all four extremities, no gross restriction  of joint movements, had right TKR in May Skin:  no rashes, no hyperemia Neurological: + slight tremor with outstretched hands, DTR normal in BLE   CMP     Component Value Date/Time   NA 135 08/02/2022 0401   K 4.1 08/02/2022 0401   CL 105 08/02/2022 0401   CO2 19 (L) 08/02/2022 0401   GLUCOSE 117 (H) 08/02/2022 0401   BUN 19 08/02/2022 0401   CREATININE 0.77 08/02/2022 0401   CALCIUM 9.3 08/02/2022 0401   GFRNONAA >60 08/02/2022 0401     CBC    Component Value Date/Time   WBC 9.5 08/02/2022 0401   RBC 3.07 (L) 08/02/2022 0401   HGB 9.9 (L) 08/02/2022 0401   HCT 31.7 (L) 08/02/2022 0401   PLT 178 08/02/2022 0401   MCV 103.3 (H) 08/02/2022 0401   MCH 32.2 08/02/2022 0401   MCHC 31.2 08/02/2022 0401   RDW 12.8 08/02/2022 0401   LYMPHSABS 1.6 07/26/2022 1135   MONOABS 0.9 07/26/2022 1135   EOSABS 0.1 07/26/2022 1135   BASOSABS 0.0 07/26/2022 1135     Diabetic Labs (most recent): Lab Results  Component Value Date   HGBA1C 6.2 (H) 07/26/2022   HGBA1C 6 10/13/2021    Lipid Panel  No results found for: "CHOL", "TRIG",  "HDL", "CHOLHDL", "VLDL", "LDLCALC", "LDLDIRECT", "LABVLDL"   Lab Results  Component Value Date   TSH 0.104 (L) 10/18/2022   TSH 0.644 04/18/2022   TSH 0.430 (L) 01/17/2022   TSH 0.36 (A) 10/13/2021   FREET4 1.09 10/18/2022   FREET4 1.04 04/18/2022   FREET4 0.99 01/17/2022      Latest Reference Range & Units 10/13/21 00:00 01/17/22 10:11 04/18/22 11:37 10/18/22 14:28  TSH 0.450 - 4.500 uIU/mL 0.36 ! (E) 0.430 (L) 0.644 0.104 (L)  Triiodothyronine,Free,Serum 2.0 - 4.4 pg/mL  3.2 3.3 4.0  T4,Free(Direct) 0.82 - 1.77 ng/dL  3.24 4.01 0.27  Thyroperoxidase Ab SerPl-aCnc 0 - 34 IU/mL  <9    Thyroglobulin Antibody 0.0 - 0.9 IU/mL  <1.0    !: Data is abnormal (L): Data is abnormally low (E): External lab result   Assessment & Plan:   1. Abnormal TSH 2. Subclinical Hyperthyroidism  she is being seen at a kind request of Assunta Found, MD.  Her repeat thyroid function tests show slightly suppressed TSH and normal FT3 and FT4 values, consistent with subclinical hyperthyroidism.  She does not need antithyroid treatment or thyroid hormone replacement at this time.    Her antibody testing was negative, ruling out autoimmune thyroid disease.  She could possibly benefit from ultrasound to assess thyroid anatomy, since R lobe is bigger than left during exam today.     -Patient is advised to maintain close follow up with Assunta Found, MD for primary care needs.    I spent  14  minutes in the care of the patient today including review of labs from Thyroid Function, CMP, and other relevant labs ; imaging/biopsy records (current and previous including abstractions from other facilities); face-to-face time discussing  her lab results and symptoms, medications doses, her options of short and long term treatment based on the latest standards of care / guidelines;   and documenting the encounter.  Maria Morrison  participated in the discussions, expressed understanding, and voiced agreement  with the above plans.  All questions were answered to her satisfaction. she is encouraged to contact clinic should she have any questions or concerns prior to her return visit.  Follow up plan: No  follow-ups on file.   Thank you for involving me in the care of this pleasant patient, and I will continue to update you with her progress.    Ronny Bacon, [facility] [address] [place], [place] [address] Phone: [phone] Fax: [phone]  10/25/2022, 9:58 AM

## 2022-11-01 ENCOUNTER — Telehealth: Payer: Self-pay | Admitting: Orthopedic Surgery

## 2022-11-01 MED ORDER — CEPHALEXIN 500 MG PO CAPS: 2000.0000 mg | ORAL_CAPSULE | Freq: Once | ORAL | 2 refills | Status: AC

## 2022-11-01 NOTE — Telephone Encounter (Signed)
Dr. Mort Sawyers pt - Dr. Jama Flavors [phone] office lvm stating that the patient needs to have a cleaning and they need to know if she needs pre meds and they are requesting a medical release to be faxed to [phone].

## 2022-11-01 NOTE — Telephone Encounter (Signed)
She has had premeds sent to her pharmacy Cephalexin 2000 units one hour prior to the dental work Faxed this phone note to them at provided fax number

## 2022-11-03 ENCOUNTER — Encounter: Payer: Self-pay | Admitting: Orthopedic Surgery

## 2022-11-03 ENCOUNTER — Ambulatory Visit (INDEPENDENT_AMBULATORY_CARE_PROVIDER_SITE_OTHER): Payer: Medicare Other | Admitting: Orthopedic Surgery

## 2022-11-03 DIAGNOSIS — Z96651 Presence of right artificial knee joint: Secondary | ICD-10-CM

## 2022-11-03 MED ORDER — CEPHALEXIN 500 MG PO CAPS: 2000.0000 mg | ORAL_CAPSULE | Freq: Once | ORAL | 2 refills | Status: AC

## 2022-11-03 NOTE — Progress Notes (Signed)
   VISIT TYPE: POST OP VISIT   Chief Complaint  Patient presents with   Post-op Follow-up    08/01/22 right knee replacement     Encounter Diagnosis  Name Primary?   S/P total knee replacement, right 08/01/22 Yes    PROCEDURE:  R TKA   DATE OF SURGERY: 08/01/22  POV # 2   Current Outpatient Medications:    acetaminophen (TYLENOL) 500 MG tablet, Take 1 tablet (500 mg total) by mouth every 6 (six) hours as needed., Disp: 30 tablet, Rfl: 0   aspirin EC 325 MG tablet, Take 1 tablet (325 mg total) by mouth daily with breakfast., Disp: 42 tablet, Rfl: 0   atorvastatin (LIPITOR) 10 MG tablet, Take 10 mg by mouth at bedtime., Disp: , Rfl:    bisacodyl (DULCOLAX) 5 MG EC tablet, Take 1 tablet (5 mg total) by mouth daily as needed for moderate constipation., Disp: 30 tablet, Rfl: 0   celecoxib (CELEBREX) 200 MG capsule, Take 1 capsule (200 mg total) by mouth 2 (two) times daily., Disp: 60 capsule, Rfl: 2   cephALEXin (KEFLEX) 500 MG capsule, Take 4 capsules (2,000 mg total) by mouth once for 1 dose. One hour prior to dental work, Disp: 4 capsule, Rfl: 2   estradiol (VIVELLE-DOT) 0.0375 MG/24HR, APPLY 1 PATCH TOPICALLY TWICE A WEEK, Disp: , Rfl:    lisinopril (PRINIVIL,ZESTRIL) 10 MG tablet, Take 10 mg by mouth at bedtime., Disp: , Rfl:    Melatonin 3 MG CAPS, Take 3 mg by mouth at bedtime., Disp: , Rfl:    Menthol, Topical Analgesic, (ASPERCREME MAX ROLL-ON EX), Apply 1 Application topically as needed (pain.)., Disp: , Rfl:    metFORMIN (GLUCOPHAGE) 500 MG tablet, Take 500 mg by mouth at bedtime., Disp: , Rfl:    methocarbamol (ROBAXIN) 500 MG tablet, Take 1 tablet (500 mg total) by mouth every 6 (six) hours as needed for muscle spasms., Disp: 56 tablet, Rfl: 2   omeprazole (PRILOSEC OTC) 20 MG tablet, Take 20 mg by mouth in the morning., Disp: , Rfl:    oxyCODONE (OXY IR/ROXICODONE) 5 MG immediate release tablet, TAKE 1 TABLET BY MOUTH EVERY 8 HOURS AS NEEDED FOR UP TO 5 DAYS FOR SEVERE PAIN,  Disp: , Rfl:    polyethylene glycol (MIRALAX / GLYCOLAX) 17 g packet, Take 17 g by mouth daily as needed for mild constipation., Disp: 14 each, Rfl: 0   progesterone (PROMETRIUM) 100 MG capsule, Take 1 capsule by mouth at bedtime., Disp: , Rfl:   Current Facility-Administered Medications:    bupivacaine-meloxicam ER (ZYNRELEF) injection 400 mg, 400 mg, Infiltration, Once, Vickki Hearing, MD   Subjective   She reports occasional sharp pain in the front of the knee  Overall very happy with her knee  Other than that no pain in the knee at all  Objective findings  Full extension excellent quadriceps control Range of motion 0-115 degreeS  No swelling no signs of infection    ASSESSMENT AND PLAN   Doing well follow-up in 9 months for 1 year x-ray  Patient will take antibiotics for dental cleanings and the recommendations of the American Academy of orthopedics were reviewed  Patient takes metformin for diabetes or prediabetes and should take antibiotics in my opinion for all dental procedures

## 2022-11-03 NOTE — Progress Notes (Signed)
Has dental work tomorrow, sent in Antibiotics per protocol

## 2023-03-28 DIAGNOSIS — E1159 Type 2 diabetes mellitus with other circulatory complications: Secondary | ICD-10-CM | POA: Diagnosis not present

## 2023-03-28 DIAGNOSIS — E782 Mixed hyperlipidemia: Secondary | ICD-10-CM | POA: Diagnosis not present

## 2023-03-28 DIAGNOSIS — I10 Essential (primary) hypertension: Secondary | ICD-10-CM | POA: Diagnosis not present

## 2023-03-28 DIAGNOSIS — R946 Abnormal results of thyroid function studies: Secondary | ICD-10-CM | POA: Diagnosis not present

## 2023-03-28 DIAGNOSIS — Z0001 Encounter for general adult medical examination with abnormal findings: Secondary | ICD-10-CM | POA: Diagnosis not present

## 2023-03-28 DIAGNOSIS — E7849 Other hyperlipidemia: Secondary | ICD-10-CM | POA: Diagnosis not present

## 2023-04-12 ENCOUNTER — Other Ambulatory Visit (HOSPITAL_COMMUNITY): Payer: Self-pay | Admitting: Family Medicine

## 2023-04-12 DIAGNOSIS — Z1231 Encounter for screening mammogram for malignant neoplasm of breast: Secondary | ICD-10-CM

## 2023-05-17 ENCOUNTER — Ambulatory Visit (HOSPITAL_COMMUNITY): Payer: Medicare Other

## 2023-05-18 ENCOUNTER — Ambulatory Visit (HOSPITAL_COMMUNITY)
Admission: RE | Admit: 2023-05-18 | Discharge: 2023-05-18 | Disposition: A | Discharge status: Home / Self Care | Payer: Medicare Other | Source: Ambulatory Visit | Attending: Family Medicine | Admitting: Family Medicine

## 2023-05-18 ENCOUNTER — Encounter (HOSPITAL_COMMUNITY): Payer: Self-pay

## 2023-05-18 DIAGNOSIS — Z1231 Encounter for screening mammogram for malignant neoplasm of breast: Secondary | ICD-10-CM | POA: Diagnosis not present

## 2023-08-02 ENCOUNTER — Other Ambulatory Visit (INDEPENDENT_AMBULATORY_CARE_PROVIDER_SITE_OTHER): Payer: Self-pay

## 2023-08-02 ENCOUNTER — Encounter: Payer: Self-pay | Admitting: Orthopedic Surgery

## 2023-08-02 ENCOUNTER — Ambulatory Visit: Admitting: Orthopedic Surgery

## 2023-08-02 DIAGNOSIS — Z96651 Presence of right artificial knee joint: Secondary | ICD-10-CM

## 2023-08-02 DIAGNOSIS — M1711 Unilateral primary osteoarthritis, right knee: Secondary | ICD-10-CM

## 2023-08-02 NOTE — Progress Notes (Signed)
   There were no vitals taken for this visit.  There is no height or weight on file to calculate BMI.  No chief complaint on file.   Encounter Diagnoses  Name Primary?   Arthritis of right knee Yes   S/P total knee replacement, right 08/01/22     DOI/DOS/ Date: 08/01/22  Improved

## 2023-08-02 NOTE — Progress Notes (Signed)
 Annual follow-up 1 year after knee replacement  Implant: Fixed-bearing posterior stabilized attune implant  Chief Complaint  Patient presents with   Post-op Follow-up    1 year follow up right knee replacmeent    Encounter Diagnoses  Name Primary?   Arthritis of right knee Yes   S/P total knee replacement, right 08/01/22     DOI/DOS/ Date: 08/01/22  Improved  DG Knee AP/LAT W/Sunrise Right Result Date: 08/02/2023 Image report right total knee 1 year after surgery Implant attune fixed-bearing posterior stabilized total knee The implant sizing and position look to be appropriate No evidence of loosening Impression appropriate alignment and positioning right total knee 1 year postop Note: OA left knee with severe joint space narrowing osteophyte formation and subchondral sclerosis    Patient complaints none   Range of motion 0-115   Plan  Return as needed patient agrees to let me know if anything changes or feels different

## 2023-08-03 ENCOUNTER — Ambulatory Visit: Payer: Medicare Other | Admitting: Orthopedic Surgery

## 2023-08-30 DIAGNOSIS — H43813 Vitreous degeneration, bilateral: Secondary | ICD-10-CM | POA: Diagnosis not present

## 2023-08-30 DIAGNOSIS — E119 Type 2 diabetes mellitus without complications: Secondary | ICD-10-CM | POA: Diagnosis not present

## 2023-08-30 DIAGNOSIS — H353131 Nonexudative age-related macular degeneration, bilateral, early dry stage: Secondary | ICD-10-CM | POA: Diagnosis not present

## 2023-08-30 DIAGNOSIS — H5203 Hypermetropia, bilateral: Secondary | ICD-10-CM | POA: Diagnosis not present

## 2023-08-30 DIAGNOSIS — Z961 Presence of intraocular lens: Secondary | ICD-10-CM | POA: Diagnosis not present

## 2023-10-08 ENCOUNTER — Other Ambulatory Visit: Payer: Self-pay | Admitting: *Deleted

## 2023-10-08 ENCOUNTER — Telehealth: Payer: Self-pay | Admitting: Nurse Practitioner

## 2023-10-08 DIAGNOSIS — R7989 Other specified abnormal findings of blood chemistry: Secondary | ICD-10-CM

## 2023-10-08 DIAGNOSIS — E0590 Thyrotoxicosis, unspecified without thyrotoxic crisis or storm: Secondary | ICD-10-CM

## 2023-10-08 NOTE — Telephone Encounter (Signed)
 Labs updated

## 2023-10-08 NOTE — Telephone Encounter (Signed)
 Pt needs labs updated

## 2023-10-09 DIAGNOSIS — Z79899 Other long term (current) drug therapy: Secondary | ICD-10-CM | POA: Diagnosis not present

## 2023-10-09 DIAGNOSIS — Z87891 Personal history of nicotine dependence: Secondary | ICD-10-CM | POA: Diagnosis not present

## 2023-10-09 DIAGNOSIS — E782 Mixed hyperlipidemia: Secondary | ICD-10-CM | POA: Diagnosis not present

## 2023-10-09 DIAGNOSIS — I10 Essential (primary) hypertension: Secondary | ICD-10-CM | POA: Diagnosis not present

## 2023-10-09 DIAGNOSIS — Z7984 Long term (current) use of oral hypoglycemic drugs: Secondary | ICD-10-CM | POA: Diagnosis not present

## 2023-10-09 DIAGNOSIS — Z7689 Persons encountering health services in other specified circumstances: Secondary | ICD-10-CM | POA: Diagnosis not present

## 2023-10-09 DIAGNOSIS — E119 Type 2 diabetes mellitus without complications: Secondary | ICD-10-CM | POA: Diagnosis not present

## 2023-10-11 ENCOUNTER — Other Ambulatory Visit (HOSPITAL_COMMUNITY): Payer: Self-pay

## 2023-10-11 DIAGNOSIS — Z13820 Encounter for screening for osteoporosis: Secondary | ICD-10-CM

## 2023-10-15 ENCOUNTER — Ambulatory Visit (HOSPITAL_COMMUNITY)
Admission: RE | Admit: 2023-10-15 | Discharge: 2023-10-15 | Disposition: A | Discharge status: Home / Self Care | Source: Ambulatory Visit

## 2023-10-15 DIAGNOSIS — M8589 Other specified disorders of bone density and structure, multiple sites: Secondary | ICD-10-CM | POA: Diagnosis not present

## 2023-10-15 DIAGNOSIS — Z780 Asymptomatic menopausal state: Secondary | ICD-10-CM | POA: Diagnosis not present

## 2023-10-15 DIAGNOSIS — Z13820 Encounter for screening for osteoporosis: Secondary | ICD-10-CM | POA: Diagnosis not present

## 2023-10-18 DIAGNOSIS — R7989 Other specified abnormal findings of blood chemistry: Secondary | ICD-10-CM | POA: Diagnosis not present

## 2023-10-18 DIAGNOSIS — E0590 Thyrotoxicosis, unspecified without thyrotoxic crisis or storm: Secondary | ICD-10-CM | POA: Diagnosis not present

## 2023-10-19 LAB — TSH: TSH: 0.663 u[IU]/mL (ref 0.450–4.500)

## 2023-10-19 LAB — T3, FREE: T3, Free: 3.5 pg/mL (ref 2.0–4.4)

## 2023-10-19 LAB — T4, FREE: Free T4: 1.05 ng/dL (ref 0.82–1.77)

## 2023-10-25 ENCOUNTER — Ambulatory Visit: Payer: Medicare Other | Admitting: Nurse Practitioner

## 2023-10-25 ENCOUNTER — Encounter: Payer: Self-pay | Admitting: Nurse Practitioner

## 2023-10-25 VITALS — BP 114/80 | HR 83 | Ht 63.0 in | Wt 189.0 lb

## 2023-10-25 DIAGNOSIS — E0590 Thyrotoxicosis, unspecified without thyrotoxic crisis or storm: Secondary | ICD-10-CM

## 2023-10-25 NOTE — Progress Notes (Signed)
 10/25/2023     Endocrinology Follow Up Note    Subjective:    Patient ID: Maria Morrison, female    DOB: [birth year], PCP Marvine Rush, MD.   Past Medical History:  Diagnosis Date   Diabetes mellitus without complication (HCC)    GERD (gastroesophageal reflux disease)    HLD (hyperlipidemia)    HTN (hypertension)    Sleep apnea     Past Surgical History:  Procedure Laterality Date   APPENDECTOMY     BACK SURGERY     COLONOSCOPY N/A 02/25/2014   Procedure: COLONOSCOPY;  Surgeon: Lamar CHRISTELLA Hollingshead, MD;  two tubular adenomas, anal papilla, internal hemorrhoids.  Recommended repeat colonoscopy in 5 years.   COLONOSCOPY N/A 06/16/2020   Surgeon: Hollingshead Lamar CHRISTELLA, MD;  entirely normal exam.  No recommendations to repeat due to age.   INJECTION KNEE Right 04/2022   TOTAL KNEE ARTHROPLASTY Right 08/01/2022   Procedure: TOTAL KNEE ARTHROPLASTY;  Surgeon: Margrette Taft BRAVO, MD;  Location: [facility];  Service: Orthopedics;  Laterality: Right;   TUBAL LIGATION      Social History   Socioeconomic History   Marital status: Married    Spouse name: Fairy   Number of children: 0   Years of education: 13   Highest education level: Not on file  Occupational History    Comment: retired  Tobacco Use   Smoking status: Former   Smokeless tobacco: Never  Substance and Sexual Activity   Alcohol use: No    Alcohol/week: 0.0 standard drinks of alcohol   Drug use: No   Sexual activity: Not on file  Other Topics Concern   Not on file  Social History Narrative   Consumes 3-4 cans of soda daily   Social Drivers of Health   Financial Resource Strain: Not on file  Food Insecurity: No Food Insecurity (08/01/2022)   Hunger Vital Sign    Worried About Running Out of Food in the Last Year: Never true    Ran Out of Food in the Last Year: Never true  Transportation Needs: No Transportation Needs (08/01/2022)   PRAPARE - Administrator, Civil Service (Medical): No    Lack  of Transportation (Non-Medical): No  Physical Activity: Not on file  Stress: Not on file  Social Connections: Not on file    Family History  Problem Relation Age of Onset   Hypertension Mother    Stroke Mother    Hypertension Father    Diabetes Father    Parkinson's disease Father    Stroke Maternal Grandfather    Stroke Paternal Grandmother    Stroke Paternal Grandfather    Colon cancer Neg Hx     Outpatient Encounter Medications as of 10/25/2023  Medication Sig   acetaminophen  (TYLENOL ) 500 MG tablet Take 1 tablet (500 mg total) by mouth every 6 (six) hours as needed.   aspirin  EC 325 MG tablet Take 1 tablet (325 mg total) by mouth daily with breakfast.   atorvastatin  (LIPITOR) 10 MG tablet Take 10 mg by mouth at bedtime.   bisacodyl  (DULCOLAX) 5 MG EC tablet Take 1 tablet (5 mg total) by mouth daily as needed for moderate constipation.   CALCIUM  PO Take by mouth daily.   celecoxib  (CELEBREX ) 200 MG capsule Take 1 capsule (200 mg total) by mouth 2 (two) times daily.   estradiol (VIVELLE-DOT) 0.0375 MG/24HR APPLY 1 PATCH TOPICALLY TWICE A WEEK   lisinopril  (PRINIVIL ,ZESTRIL ) 10 MG tablet Take 10 mg  by mouth at bedtime.   Melatonin 3 MG CAPS Take 3 mg by mouth at bedtime.   Menthol , Topical Analgesic, (ASPERCREME MAX ROLL-ON EX) Apply 1 Application topically as needed (pain.).   metFORMIN  (GLUCOPHAGE ) 500 MG tablet Take 500 mg by mouth at bedtime.   methocarbamol  (ROBAXIN ) 500 MG tablet Take 1 tablet (500 mg total) by mouth every 6 (six) hours as needed for muscle spasms.   omeprazole  (PRILOSEC  OTC) 20 MG tablet Take 20 mg by mouth in the morning.   oxyCODONE  (OXY IR/ROXICODONE ) 5 MG immediate release tablet TAKE 1 TABLET BY MOUTH EVERY 8 HOURS AS NEEDED FOR UP TO 5 DAYS FOR SEVERE PAIN   progesterone (PROMETRIUM) 100 MG capsule Take 1 capsule by mouth at bedtime.   Vitamin D, Cholecalciferol, 25 MCG (1000 UT) CAPS Take 1,000 Units by mouth daily.   polyethylene glycol (MIRALAX  /  GLYCOLAX ) 17 g packet Take 17 g by mouth daily as needed for mild constipation.   Facility-Administered Encounter Medications as of 10/25/2023  Medication   bupivacaine -meloxicam  ER (ZYNRELEF ) injection 400 mg    ALLERGIES: Allergies  Allergen Reactions   Codeine Nausea Only    VACCINATION STATUS: Immunization History  Administered Date(s) Administered   Influenza-Unspecified 12/05/2014, 12/04/2017     HPI  Maria Morrison is 78 y.o. female who presents today with a medical history as above. she is being seen in follow up after being seen in consultation for hyperthyroidism requested by Marvine Rush, MD.  she has been dealing with symptoms of loose bowels, hot flashes, and tremors intermittently for 10-15 years. These symptoms are progressively worsening and troubling to her.  her most recent thyroid  labs revealed marginally suppressed TSH of 0.359 on 10/13/21.  she denies dysphagia, choking, shortness of breath, no recent voice change.    she denies known family history of thyroid  dysfunction and denies family hx of thyroid  cancer. she denies personal history of goiter. she is not on any anti-thyroid  medications nor on any thyroid  hormone supplements. Denies use of Biotin containing supplements.  she is willing to proceed with appropriate work up and therapy for thyrotoxicosis.  She has never had any imaging of her thyroid  in the past, nor been exposed to radiation.  She mentions her PCP thinks her symptoms and suppression of her TSH could be contributed by her Estradiol and Progesterone use (prescribed to control hot flashes).   Review of systems  Constitutional: + Minimally fluctuating body weight,  current Body mass index is 33.48 kg/m. , no fatigue, + subjective hyperthermia-continued, no subjective hypothermia Eyes: no blurry vision, no xerophthalmia ENT: no sore throat, no nodules palpated in throat, no dysphagia/odynophagia, no hoarseness Cardiovascular: no chest pain, no  shortness of breath, no palpitations, no leg swelling Respiratory: no cough, no shortness of breath Gastrointestinal: no nausea/vomiting/diarrhea Musculoskeletal: no muscle/joint aches Skin: no rashes, no hyperemia Neurological: no tremors, no numbness, no tingling, no dizziness Psychiatric: no depression, no anxiety   Objective:    BP 114/80 (BP Location: Right Arm, Patient Position: Sitting, Cuff Size: Large)   Pulse 83   Ht 5' 3 (1.6 m)   Wt 189 lb (85.7 kg)   BMI 33.48 kg/m   Wt Readings from Last 3 Encounters:  10/25/23 189 lb (85.7 kg)  10/25/22 189 lb 9.6 oz (86 kg)  09/08/22 201 lb (91.2 kg)     BP Readings from Last 3 Encounters:  10/25/23 114/80  10/25/22 133/78  08/14/22 (!) 194/82     Physical Exam-  Limited  Constitutional:  Body mass index is 33.48 kg/m. , not in acute distress, normal state of mind Eyes:  EOMI, no exophthalmos Musculoskeletal: no gross deformities, strength intact in all four extremities, no gross restriction of joint movements Skin:  no rashes, no hyperemia Neurological: no tremor with outstretched hands   CMP     Component Value Date/Time   NA 135 08/02/2022 0401   K 4.1 08/02/2022 0401   CL 105 08/02/2022 0401   CO2 19 (L) 08/02/2022 0401   GLUCOSE 117 (H) 08/02/2022 0401   BUN 19 08/02/2022 0401   CREATININE 0.77 08/02/2022 0401   CALCIUM  9.3 08/02/2022 0401   GFRNONAA >60 08/02/2022 0401     CBC    Component Value Date/Time   WBC 9.5 08/02/2022 0401   RBC 3.07 (L) 08/02/2022 0401   HGB 9.9 (L) 08/02/2022 0401   HCT 31.7 (L) 08/02/2022 0401   PLT 178 08/02/2022 0401   MCV 103.3 (H) 08/02/2022 0401   MCH 32.2 08/02/2022 0401   MCHC 31.2 08/02/2022 0401   RDW 12.8 08/02/2022 0401   LYMPHSABS 1.6 07/26/2022 1135   MONOABS 0.9 07/26/2022 1135   EOSABS 0.1 07/26/2022 1135   BASOSABS 0.0 07/26/2022 1135     Diabetic Labs (most recent): Lab Results  Component Value Date   HGBA1C 6.2 (H) 07/26/2022   HGBA1C 6  10/13/2021    Lipid Panel  No results found for: CHOL, TRIG, HDL, CHOLHDL, VLDL, LDLCALC, LDLDIRECT, LABVLDL   Lab Results  Component Value Date   TSH 0.663 10/18/2023   TSH 0.104 (L) 10/18/2022   TSH 0.644 04/18/2022   TSH 0.430 (L) 01/17/2022   TSH 0.36 (A) 10/13/2021   FREET4 1.05 10/18/2023   FREET4 1.09 10/18/2022   FREET4 1.04 04/18/2022   FREET4 0.99 01/17/2022      Latest Reference Range & Units 10/13/21 00:00 01/17/22 10:11 04/18/22 11:37 10/18/22 14:28 10/18/23 10:14  TSH 0.450 - 4.500 uIU/mL 0.36 ! (E) 0.430 (L) 0.644 0.104 (L) 0.663  Triiodothyronine,Free,Serum 2.0 - 4.4 pg/mL  3.2 3.3 4.0 3.5  T4,Free(Direct) 0.82 - 1.77 ng/dL  9.00 8.95 8.90 8.94  Thyroperoxidase Ab SerPl-aCnc 0 - 34 IU/mL  <9     Thyroglobulin Antibody 0.0 - 0.9 IU/mL  <1.0     !: Data is abnormal (L): Data is abnormally low (E): External lab result   Assessment & Plan:   1. Abnormal TSH 2. Subclinical Hyperthyroidism  she is being seen at a kind request of Marvine Rush, MD.  Her repeat thyroid  function tests show euthyroid presentation.  She does not need antithyroid treatment or thyroid  hormone replacement at this time.    Her antibody testing was negative, ruling out autoimmune thyroid  disease.       -Patient is advised to maintain close follow up with Marvine Rush, MD for primary care needs.     I spent  14  minutes in the care of the patient today including review of labs from Thyroid  Function, CMP, and other relevant labs ; imaging/biopsy records (current and previous including abstractions from other facilities); face-to-face time discussing  her lab results and symptoms, medications doses, her options of short and long term treatment based on the latest standards of care / guidelines;   and documenting the encounter.  Maria Morrison  participated in the discussions, expressed understanding, and voiced agreement with the above plans.  All questions were  answered to her satisfaction. she is encouraged to contact clinic should she  have any questions or concerns prior to her return visit.  Follow up plan: Return in about 1 year (around 10/24/2024) for Previsit labs, Thyroid  follow up.   Thank you for involving me in the care of this pleasant patient, and I will continue to update you with her progress.   Benton Rio, [facility] [address] [place], [place] [address] Phone: [phone] Fax: [phone]  10/25/2023, 1:38 PM

## 2023-11-13 DIAGNOSIS — E782 Mixed hyperlipidemia: Secondary | ICD-10-CM | POA: Diagnosis not present

## 2023-11-13 DIAGNOSIS — E119 Type 2 diabetes mellitus without complications: Secondary | ICD-10-CM | POA: Diagnosis not present

## 2023-11-19 DIAGNOSIS — Z7984 Long term (current) use of oral hypoglycemic drugs: Secondary | ICD-10-CM | POA: Diagnosis not present

## 2023-11-19 DIAGNOSIS — Z23 Encounter for immunization: Secondary | ICD-10-CM | POA: Diagnosis not present

## 2023-11-19 DIAGNOSIS — Z87891 Personal history of nicotine dependence: Secondary | ICD-10-CM | POA: Diagnosis not present

## 2023-11-19 DIAGNOSIS — Z79899 Other long term (current) drug therapy: Secondary | ICD-10-CM | POA: Diagnosis not present

## 2023-11-19 DIAGNOSIS — I10 Essential (primary) hypertension: Secondary | ICD-10-CM | POA: Diagnosis not present

## 2023-11-19 DIAGNOSIS — E782 Mixed hyperlipidemia: Secondary | ICD-10-CM | POA: Diagnosis not present

## 2023-11-19 DIAGNOSIS — R748 Abnormal levels of other serum enzymes: Secondary | ICD-10-CM | POA: Diagnosis not present

## 2023-11-19 DIAGNOSIS — E119 Type 2 diabetes mellitus without complications: Secondary | ICD-10-CM | POA: Diagnosis not present

## 2023-11-19 DIAGNOSIS — E0590 Thyrotoxicosis, unspecified without thyrotoxic crisis or storm: Secondary | ICD-10-CM | POA: Diagnosis not present

## 2024-10-24 ENCOUNTER — Ambulatory Visit: Admitting: Nurse Practitioner
